# Patient Record
Sex: Female | Born: 1980 | Race: Black or African American | Hispanic: No | Marital: Married | Smoking: Former smoker
Health system: Southern US, Community
[De-identification: ages and names within clinical notes are randomized; demographics above are authoritative.]

## PROBLEM LIST (undated history)

## (undated) DIAGNOSIS — N920 Excessive and frequent menstruation with regular cycle: Secondary | ICD-10-CM

## (undated) DIAGNOSIS — M419 Scoliosis, unspecified: Secondary | ICD-10-CM

## (undated) DIAGNOSIS — E785 Hyperlipidemia, unspecified: Secondary | ICD-10-CM

## (undated) DIAGNOSIS — Z8632 Personal history of gestational diabetes: Secondary | ICD-10-CM

## (undated) DIAGNOSIS — Z973 Presence of spectacles and contact lenses: Secondary | ICD-10-CM

## (undated) DIAGNOSIS — D259 Leiomyoma of uterus, unspecified: Secondary | ICD-10-CM

## (undated) DIAGNOSIS — R7303 Prediabetes: Secondary | ICD-10-CM

## (undated) HISTORY — PX: BACK SURGERY: SHX140

## (undated) HISTORY — PX: WISDOM TOOTH EXTRACTION: SHX21

---
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
## 2001-03-28 ENCOUNTER — Emergency Department (HOSPITAL_COMMUNITY): Admission: EM | Admit: 2001-03-28 | Discharge: 2001-03-29 | Payer: Self-pay | Admitting: Emergency Medicine

## 2001-12-12 ENCOUNTER — Other Ambulatory Visit: Admission: RE | Admit: 2001-12-12 | Discharge: 2001-12-12 | Payer: Self-pay | Admitting: Obstetrics & Gynecology

## 2002-11-25 ENCOUNTER — Other Ambulatory Visit: Admission: RE | Admit: 2002-11-25 | Discharge: 2002-11-25 | Payer: Self-pay | Admitting: Obstetrics & Gynecology

## 2003-03-01 ENCOUNTER — Encounter: Admission: RE | Admit: 2003-03-01 | Discharge: 2003-03-30 | Payer: Self-pay | Admitting: Neurosurgery

## 2005-03-26 ENCOUNTER — Emergency Department (HOSPITAL_COMMUNITY): Admission: EM | Admit: 2005-03-26 | Discharge: 2005-03-27 | Payer: Self-pay | Admitting: Emergency Medicine

## 2006-01-06 ENCOUNTER — Emergency Department (HOSPITAL_COMMUNITY): Admission: EM | Admit: 2006-01-06 | Discharge: 2006-01-07 | Payer: Self-pay | Admitting: Emergency Medicine

## 2006-04-18 ENCOUNTER — Encounter (INDEPENDENT_AMBULATORY_CARE_PROVIDER_SITE_OTHER): Payer: Self-pay | Admitting: Specialist

## 2006-04-18 ENCOUNTER — Ambulatory Visit (HOSPITAL_COMMUNITY): Admission: AD | Admit: 2006-04-18 | Discharge: 2006-04-18 | Payer: Self-pay | Admitting: Obstetrics and Gynecology

## 2006-04-18 HISTORY — PX: DILATION AND EVACUATION: SHX1459

## 2006-12-24 ENCOUNTER — Encounter: Admission: RE | Admit: 2006-12-24 | Discharge: 2006-12-24 | Payer: Self-pay | Admitting: Certified Nurse Midwife

## 2007-03-04 ENCOUNTER — Inpatient Hospital Stay: Admission: AD | Admit: 2007-03-04 | Discharge: 2007-03-04 | Payer: Self-pay | Admitting: *Deleted

## 2007-03-05 ENCOUNTER — Inpatient Hospital Stay (HOSPITAL_COMMUNITY): Admission: AD | Admit: 2007-03-05 | Discharge: 2007-03-07 | Payer: Self-pay | Admitting: Obstetrics & Gynecology

## 2009-07-05 ENCOUNTER — Emergency Department (HOSPITAL_COMMUNITY): Admission: EM | Admit: 2009-07-05 | Discharge: 2009-07-05 | Payer: Self-pay | Admitting: Emergency Medicine

## 2009-08-02 ENCOUNTER — Emergency Department (HOSPITAL_COMMUNITY): Admission: EM | Admit: 2009-08-02 | Discharge: 2009-08-02 | Payer: Self-pay | Admitting: Emergency Medicine

## 2010-06-23 NOTE — Op Note (Signed)
[name], [name] NO.:  [id number]   MEDICAL RECORD NO.:  [id number]          PATIENT TYPE:  MAT   LOCATION:  [facility]                          FACILITY:  [facility]   PHYSICIAN:  Richardean Sale, M.D.   DATE OF BIRTH:  [birth year]   DATE OF PROCEDURE:  04/18/2006  DATE OF DISCHARGE:                               OPERATIVE REPORT   PREOPERATIVE DIAGNOSIS:  Abnormal uterine bleeding, presumed retained  products of conception.   POSTOPERATIVE DIAGNOSIS:  Abnormal uterine bleeding, presumed retained  products of conception.   PROCEDURE:  Suction, dilatation and curettage.   SURGEON:  Richardean Sale, MD.   ASSISTANT:  None.   ANESTHESIA:  Conscious sedation with paracervical block.   OPERATIVE FINDINGS:  An 8-week-size uterus with sound length of 8 cm,  moderate amount of old necrotic products of conception and blood clot.   SPECIMENS:  Products of conception, sent to Pathology.   ESTIMATED BLOOD LOSS:  Minimal.   COMPLICATIONS:  None.   INDICATIONS:  This is a 30 year old gravida 2, para 1-0-1-1 African  American female who was diagnosed with a missed AB in December 2007.  The patient states her bleeding ultimately stopped and she had a normal  period in February of this year.  She subsequently awoke this morning  with heavy vaginal bleeding and significant cramping, which prompted her  to report to the ER.  Ultrasound was performed, which revealed a very  thickened endometrial stripe with the tissue suspicious for retained  products of conception.  She presents now for surgical management with  D&E.  Prior to the procedure, the risks, benefits and alternatives were  discussed with the patient in detail.  We discussed the risks which  include, but are not limited to, hemorrhage requiring transfusion,  infection, injury to the uterus such as uterine perforation which would  require additional surgery to the abdomen to evaluate for any injury to  the  intra-abdominal.  We also discussed the risks of anesthesia and  other surgical-related risks.  The patient voices understanding of the  above and desires to proceed.  Informed was obtained and all questions  answered before proceeding to the OR.   PROCEDURE:  The patient was taken to the operating room, where she was  given intravenous sedation.  She was then prepped and draped in the  usual sterile fashion with Betadine and placed in the dorsal lithotomy  position.  A rubber catheter was used to drain the bladder.  Bimanual  exam was performed.  The uterus was midline, approximately 8 weeks'  size, mobile with no obvious adnexal masses.  Paracervical block was  administered using a total of 20 mL to 1% Nesacaine and a single-tooth  tenaculum was used to grasp the anterior lip of the cervix.  The uterus  then sounded to 8 cm and the cervix was very gently dilated with Hegar  dilators.  A #7 suction curette was then introduced and suction was  applied; a moderate amount of old necrotic products of conception were  removed as well as a large amount of old  blood.  After suction was  complete, this was followed by sharp curettage and there were additional  tissue fragments that were removed.  Suction was then applied one more  time to remove any loose tissue and once suction was complete, the  procedure was then terminated.  The single-tooth tenaculum was removed  and the tenaculum site was then cauterized with silver nitrate.  There  was minimal bleeding coming from the cervical os.  All instruments were  then removed from the patient's vagina and the patient was then awakened  from her anesthesia and was taken out of dorsal lithotomy position and  was taken to recovery room, awake and in stable condition and there were  no complications.  All sponge lap, needle and instrument counts were  correct x 2.      Richardean Sale, M.D.  Electronically Signed     JW/MEDQ  D:  04/18/2006  T:   04/18/2006  Job:  [id number]

## 2010-06-23 NOTE — H&P (Signed)
[name], Gross NO.:  [id number]   MEDICAL RECORD NO.:  [id number]          PATIENT TYPE:  MAT   LOCATION:  [facility]                          FACILITY:  [facility]   PHYSICIAN:  [name], M.D.   DATE OF BIRTH:  February 12, 1980   DATE OF ADMISSION:  04/18/2006  DATE OF DISCHARGE:                              HISTORY & PHYSICAL   CHIEF COMPLAINT:  Vaginal bleeding.   HISTORY OF PRESENT ILLNESS:  This is a 30 year old African-American  female, gravida 2, para 1-0-1-1, who presented today to [facility]  complaining of heavy vaginal bleeding beginning at approximately 11 a.m.  The patient also noted onset of significant cramping pain up to 9/10 in  severity.  Her last menstrual period was February 23 and was normal.  She is not currently on any type of hormonal medication for  contraception.  She denies fevers, chills or sweats.   PAST MEDICAL HISTORY:  Significant for a spontaneous AB December 2007.  The patient states she was followed with serial quantitative HCGs but  missed her last two doctor's appointments and said the quantitative HCGs  were trending down but she is uncertain if they returned all the way to  0.  She states her bleeding stopped and she had a normal cycle this past  February.   PAST MEDICAL HISTORY:  Significant for scoliosis.   SURGICAL HISTORY:  Harrington rod placement.   OBSTETRICAL HISTORY:  Spontaneous vaginal delivery x1.   GYNECOLOGIC HISTORY:  Denies abnormal Paps or sexually-transmitted  infections.   SOCIAL HISTORY:  She smokes but denies alcohol or illegal drugs.   FAMILY HISTORY:  Noncontributory.   PHYSICAL EXAMINATION:  VITAL SIGNS:  She is afebrile.  Vital signs:  Blood pressure 120/81, pulse 69, respirations 20.  GENERAL:  She is a well-developed, well-nourished African-American  female who is in no acute distress.  HEART:  Regular rate and rhythm.  LUNGS:  Clear to auscultation bilaterally.  ABDOMEN:  Soft,  nontender, nondistended, no palpable masses.  Liver and  spleen are normal.  No hernia.  EXTREMITIES:  No cyanosis, clubbing or edema.  PELVIC:  External genitalia appear normal but smeared with blood.  Speculum exam:  There is a 5 cm diameter clot present in the vault that  is removed.  Cervix appears closed.  Bimanual exam:  The uterus is  approximately 8 weeks' size, anteverted and tender to palpation.  Adnexa:  No obvious masses.  No cervical motion tenderness.   LABORATORY DATA:  Urine pregnancy test is negative.  CBC normal with  hemoglobin of 12.3.  Ultrasound was performed, which showed an  endometrial canal filled with heterogeneous material, distended to 3.4  cm.  A small anterior portion of this material shows flow, raising the  possibility of retained products of conception.  Normal-appearing  ovaries.   ASSESSMENT:  A 30 year old gravida 2, para 1-0-1-1, African-American  female with heavy vaginal bleeding and probable retained products of  conception based on ultrasound and her recent clinical history.   PLAN:  1. Recommend D&C for evacuation of any retained tissue.  Will check      serum quantitaive hCG and, if positive, will follow weekly until 0      after the procedure.  Will administer Flagyl 500 mg IV to help      protect against endometritis.  2. The risks of D&E were reviewed with the patient in detail.  We      discussed the risks, which include but are not limited to      hemorrhage requiring transfusion, infection, uterine perforation      which would require additional surgery through the abdomen and      could result in initial surgery such as hysterectomy.  We also      discussed the risks of anesthesia.  The patient voiced      understanding of all the above and desires to proceed.  Informed      consent has been obtained and all questions answered.      [name], M.D.  Electronically Signed     JW/MEDQ  D:  04/18/2006  T:  04/18/2006  Job:   [id number]

## 2010-10-26 LAB — CBC
HCT: 26.8 — ABNORMAL LOW
HCT: 35.8 — ABNORMAL LOW
HCT: 36.9
Hemoglobin: 12.3
Hemoglobin: 12.6
Hemoglobin: 9.1 — ABNORMAL LOW
MCHC: 34
MCHC: 34.2
MCHC: 34.5
MCV: 83.9
MCV: 84.6
MCV: 86.1
Platelets: 174
Platelets: 215
Platelets: 235
RBC: 3.11 — ABNORMAL LOW
RBC: 4.26
RBC: 4.36
RDW: 14.4
RDW: 14.4
RDW: 14.5
WBC: 13.2 — ABNORMAL HIGH
WBC: 5.2
WBC: 6.2

## 2010-10-26 LAB — COMPREHENSIVE METABOLIC PANEL
ALT: 10
ALT: 16
ALT: 17
AST: 24
AST: 29
Albumin: 2 — ABNORMAL LOW
Albumin: 2.6 — ABNORMAL LOW
Alkaline Phosphatase: 127 — ABNORMAL HIGH
Alkaline Phosphatase: 81
BUN: 1 — ABNORMAL LOW
BUN: 1 — ABNORMAL LOW
CO2: 22
CO2: 25
Calcium: 8 — ABNORMAL LOW
Calcium: 8.8
Chloride: 106
Chloride: 107
Creatinine, Ser: 0.46
Creatinine, Ser: 0.47
GFR calc Af Amer: >60
GFR calc Af Amer: >60
GFR calc Af Amer: >60
GFR calc non Af Amer: >60
GFR calc non Af Amer: >60
Glucose, Bld: 90
Glucose, Bld: 94
Potassium: 2.9 — ABNORMAL LOW
Potassium: 2.9 — ABNORMAL LOW
Potassium: 3.5
Sodium: 136
Sodium: 137
Sodium: 137
Total Bilirubin: 0.3
Total Bilirubin: 0.5
Total Protein: 4.6 — ABNORMAL LOW
Total Protein: 5.8 — ABNORMAL LOW
Total Protein: 6

## 2010-10-26 LAB — DIFFERENTIAL
Basophils Absolute: 0
Basophils Relative: 0
Lymphs Abs: 3.3
Monocytes Relative: 7
Neutrophils Relative %: 66

## 2010-10-26 LAB — URIC ACID
Uric Acid, Serum: 3.5
Uric Acid, Serum: 3.9
Uric Acid, Serum: 4

## 2010-10-26 LAB — RPR: RPR Ser Ql: NONREACTIVE

## 2012-01-26 ENCOUNTER — Encounter (HOSPITAL_BASED_OUTPATIENT_CLINIC_OR_DEPARTMENT_OTHER): Payer: Self-pay | Admitting: Emergency Medicine

## 2012-01-26 ENCOUNTER — Emergency Department (HOSPITAL_BASED_OUTPATIENT_CLINIC_OR_DEPARTMENT_OTHER)
Admission: EM | Admit: 2012-01-26 | Discharge: 2012-01-26 | Disposition: A | Discharge status: Home / Self Care | Payer: BC Managed Care – PPO | Attending: Emergency Medicine | Admitting: Emergency Medicine

## 2012-01-26 DIAGNOSIS — B9789 Other viral agents as the cause of diseases classified elsewhere: Secondary | ICD-10-CM

## 2012-01-26 DIAGNOSIS — R51 Headache: Secondary | ICD-10-CM | POA: Insufficient documentation

## 2012-01-26 DIAGNOSIS — M4120 Other idiopathic scoliosis, site unspecified: Secondary | ICD-10-CM | POA: Insufficient documentation

## 2012-01-26 DIAGNOSIS — F17200 Nicotine dependence, unspecified, uncomplicated: Secondary | ICD-10-CM | POA: Insufficient documentation

## 2012-01-26 DIAGNOSIS — J029 Acute pharyngitis, unspecified: Secondary | ICD-10-CM | POA: Insufficient documentation

## 2012-01-26 HISTORY — DX: Scoliosis, unspecified: M41.9

## 2012-01-26 MED ORDER — NAPROXEN 375 MG PO TABS: 375.0000 mg | ORAL_TABLET | Freq: Two times a day (BID) | ORAL | Status: DC

## 2012-01-26 MED ORDER — LIDOCAINE VISCOUS 2 % MT SOLN
20.0000 mL | Freq: Once | OROMUCOSAL | Status: AC
  Administered 2012-01-26: 15 mL via OROMUCOSAL
  Filled 2012-01-26: qty 15

## 2012-01-26 NOTE — ED Notes (Signed)
Pt c/o sore throat x 1 week

## 2012-01-26 NOTE — ED Provider Notes (Signed)
History     CSN: [id number]  Arrival date & time 01/26/12  0149   First MD Initiated Contact with Patient 01/26/12 [phone]      Chief Complaint  Patient presents with  . Sore Throat  . Headache    (Consider location/radiation/quality/duration/timing/severity/associated sxs/prior treatment) Patient is a 31 y.o. female presenting with pharyngitis and headaches. The history is provided by the patient. No language interpreter was used.  Sore Throat This is a new problem. The current episode started more than 1 week ago. The problem occurs constantly. The problem has not changed since onset.Associated symptoms include headaches. Pertinent negatives include no chest pain, no abdominal pain and no shortness of breath. Nothing aggravates the symptoms. Nothing relieves the symptoms. She has tried nothing for the symptoms. The treatment provided no relief.  Headache  This is a new problem. The current episode started more than 1 week ago. The problem occurs constantly. The problem has not changed since onset.The headache is associated with nothing. The pain is located in the frontal region. The quality of the pain is described as dull. The pain is mild. The pain does not radiate. Pertinent negatives include no anorexia, no fever, no malaise/fatigue, no chest pressure, no shortness of breath, no nausea and no vomiting. She has tried nothing for the symptoms. The treatment provided no relief.    Past Medical History  Diagnosis Date  . Scoliosis     Past Surgical History  Procedure Date  . Back surgery     No family history on file.  History  Substance Use Topics  . Smoking status: Current Every Day Smoker  . Smokeless tobacco: Not on file  . Alcohol Use: Yes    OB History    Grav Para Term Preterm Abortions TAB SAB Ect Mult Living                  Review of Systems  Constitutional: Negative for fever and malaise/fatigue.  HENT: Positive for sore throat. Negative for ear pain,  congestion, facial swelling, drooling, trouble swallowing, neck pain, neck stiffness and voice change.   Respiratory: Negative for shortness of breath.   Cardiovascular: Negative for chest pain.  Gastrointestinal: Negative for nausea, vomiting, abdominal pain and anorexia.  Neurological: Positive for headaches.  All other systems reviewed and are negative.    Allergies  Review of patient's allergies indicates no known allergies.  Home Medications  No current outpatient prescriptions on file.  BP 147/92  Pulse 72  Temp 99.3 F (37.4 C) (Oral)  Resp 18  Ht 5\' 2"  (1.575 m)  Wt 135 lb (61.236 kg)  BMI 24.69 kg/m2  SpO2 100%  Physical Exam  Constitutional: She is oriented to person, place, and time. She appears well-developed and well-nourished. No distress.  HENT:  Head: Normocephalic and atraumatic.  Right Ear: External ear normal. No mastoid tenderness. Tympanic membrane is not injected.  Left Ear: External ear normal. No mastoid tenderness. Tympanic membrane is not injected.  Mouth/Throat: Oropharynx is clear and moist. No oropharyngeal exudate.  Eyes: Conjunctivae normal and EOM are normal. Pupils are equal, round, and reactive to light.  Neck: Normal range of motion. Neck supple.  Cardiovascular: Normal rate and regular rhythm.   Pulmonary/Chest: Effort normal and breath sounds normal. No stridor. She has no wheezes.  Abdominal: Soft. Bowel sounds are normal. There is no tenderness.  Musculoskeletal: Normal range of motion.  Lymphadenopathy:    She has no cervical adenopathy.  Neurological: She is alert and  oriented to person, place, and time. No cranial nerve deficit.  Skin: Skin is warm and dry.  Psychiatric: She has a normal mood and affect.    ED Course  Procedures (including critical care time)   Labs Reviewed  RAPID STREP SCREEN   No results found.   No diagnosis found.    MDM  Pharyngitis likely viral.  No exudate no fevers.  Follow up with your  family doctor for ongoing care        Jaclin Finks K Mahamud Metts-Rasch, MD 01/26/12 [phone]

## 2012-01-26 NOTE — ED Notes (Signed)
rx x 1 given for naprosyn

## 2015-10-14 ENCOUNTER — Other Ambulatory Visit: Payer: Self-pay | Admitting: Obstetrics and Gynecology

## 2015-10-17 ENCOUNTER — Encounter (HOSPITAL_COMMUNITY): Payer: Self-pay | Admitting: *Deleted

## 2015-10-26 NOTE — Anesthesia Preprocedure Evaluation (Signed)
Anesthesia Evaluation  Patient identified by MRN, date of birth, ID band Patient awake    Reviewed: Allergy & Precautions, NPO status , Patient's Chart, lab work & pertinent test results  History of Anesthesia Complications Negative for: history of anesthetic complications  Airway Mallampati: II  TM Distance: >3 FB Neck ROM: Full    Dental no notable dental hx. (+) Dental Advisory Given   Pulmonary Current Smoker,    Pulmonary exam normal breath sounds clear to auscultation       Cardiovascular negative cardio ROS Normal cardiovascular exam Rhythm:Regular Rate:Normal     Neuro/Psych negative neurological ROS  negative psych ROS   GI/Hepatic negative GI ROS, Neg liver ROS,   Endo/Other  obesity  Renal/GU negative Renal ROS  negative genitourinary   Musculoskeletal negative musculoskeletal ROS (+)   Abdominal   Peds negative pediatric ROS (+)  Hematology negative hematology ROS (+)   Anesthesia Other Findings   Reproductive/Obstetrics negative OB ROS                             Anesthesia Physical Anesthesia Plan  ASA: II  Anesthesia Plan: General   Post-op Pain Management:    Induction: Intravenous  Airway Management Planned: LMA  Additional Equipment:   Intra-op Plan:   Post-operative Plan: Extubation in OR  Informed Consent: I have reviewed the patients History and Physical, chart, labs and discussed the procedure including the risks, benefits and alternatives for the proposed anesthesia with the patient or authorized representative who has indicated his/her understanding and acceptance.   Dental advisory given  Plan Discussed with: CRNA  Anesthesia Plan Comments:         Anesthesia Quick Evaluation

## 2015-10-26 NOTE — H&P (Signed)
[name] is an 35 y.o. female G32P2 BF presents for surgical management of 1.8 cm intracavitary fibroid noted on Sonogram done for DUB.   Pertinent Gynecological History: Menses: DUB Bleeding: dysfunctional uterine bleeding Contraception: Nexplanon DES exposure: denies Blood transfusions: none Sexually transmitted diseases: no past history Previous GYN Procedures: DNC  Last mammogram: n/Gross Date: n/Gross Last pap: normal Date: 09/28/2015 OB History: G3, P2012   Menstrual History: Menarche age: n/Gross Patient's last menstrual period was 10/09/2015 (exact date).    Past Medical History:  Diagnosis Date  . Scoliosis   . SVD (spontaneous vaginal delivery)    x 2    Past Surgical History:  Procedure Laterality Date  . BACK SURGERY     rods in lower back  . WISDOM TOOTH EXTRACTION      History reviewed. No pertinent family history.  Social History:  reports that she has been smoking Cigarettes.  She has Gross 7.50 pack-year smoking history. She has never used smokeless tobacco. She reports that she drinks alcohol. She reports that she does not use drugs.  Allergies: No Known Allergies  No prescriptions prior to admission.    Review of Systems  All other systems reviewed and are negative.   Height 5\' 2"  (1.575 m), weight 74.8 kg (165 lb), last menstrual period 10/09/2015. Physical Exam  Constitutional: She is oriented to person, place, and time. She appears well-developed and well-nourished.  HENT:  Head: Atraumatic.  Eyes: EOM are normal.  Neck: Neck supple.  Cardiovascular: Regular rhythm.   Respiratory: Breath sounds normal.  GI: Soft.  Genitourinary: Vagina normal and uterus normal.  Musculoskeletal: She exhibits no edema.  Neurological: She is alert and oriented to person, place, and time.  Skin: Skin is warm and dry.  Psychiatric: She has Gross normal mood and affect.    No results found for this or any previous visit (from the past 24 hour(s)).  No results  found.  Assessment/Plan: DUB Submucosal( intracavitary ) fibroid P) Dx hysteroscopy, D&C, hysteroscopic resection of submucosal fibroid using myosure. Procedure explained. Risk of surgery including infection, bleeding, injury to surrounding organ Structures, thermal injury, fluid overload and its mgmt, uterine perforation and its risk and mgmt.  All ? answered  [name]

## 2015-10-27 ENCOUNTER — Ambulatory Visit (HOSPITAL_COMMUNITY)
Admission: RE | Admit: 2015-10-27 | Discharge: 2015-10-27 | Disposition: A | Discharge status: Home / Self Care | Payer: 59 | Source: Ambulatory Visit | Attending: Obstetrics and Gynecology | Admitting: Obstetrics and Gynecology

## 2015-10-27 ENCOUNTER — Ambulatory Visit (HOSPITAL_COMMUNITY): Payer: 59 | Admitting: Anesthesiology

## 2015-10-27 ENCOUNTER — Encounter (HOSPITAL_COMMUNITY)
Admission: RE | Disposition: A | Discharge status: Home / Self Care | Payer: Self-pay | Source: Ambulatory Visit | Attending: Obstetrics and Gynecology

## 2015-10-27 ENCOUNTER — Encounter (HOSPITAL_COMMUNITY): Payer: Self-pay | Admitting: *Deleted

## 2015-10-27 DIAGNOSIS — F17210 Nicotine dependence, cigarettes, uncomplicated: Secondary | ICD-10-CM | POA: Insufficient documentation

## 2015-10-27 DIAGNOSIS — Z6830 Body mass index (BMI) 30.0-30.9, adult: Secondary | ICD-10-CM | POA: Diagnosis not present

## 2015-10-27 DIAGNOSIS — N938 Other specified abnormal uterine and vaginal bleeding: Secondary | ICD-10-CM | POA: Diagnosis present

## 2015-10-27 DIAGNOSIS — E861 Hypovolemia: Secondary | ICD-10-CM

## 2015-10-27 DIAGNOSIS — E669 Obesity, unspecified: Secondary | ICD-10-CM | POA: Insufficient documentation

## 2015-10-27 DIAGNOSIS — D250 Submucous leiomyoma of uterus: Secondary | ICD-10-CM

## 2015-10-27 DIAGNOSIS — M419 Scoliosis, unspecified: Secondary | ICD-10-CM | POA: Diagnosis not present

## 2015-10-27 HISTORY — PX: DILATATION & CURETTAGE/HYSTEROSCOPY WITH MYOSURE: SHX6511

## 2015-10-27 LAB — BASIC METABOLIC PANEL
ANION GAP: 4 — AB (ref 5–15)
BUN: 11 mg/dL (ref 6–20)
CALCIUM: 8.1 mg/dL — AB (ref 8.9–10.3)
CO2: 25 mmol/L (ref 22–32)
CREATININE: 0.67 mg/dL (ref 0.44–1.00)
Chloride: 109 mmol/L (ref 101–111)
GFR calc Af Amer: >60 mL/min (ref >60)
GFR calc non Af Amer: >60 mL/min (ref >60)
GLUCOSE: 102 mg/dL — AB (ref 65–99)
Potassium: 4.3 mmol/L (ref 3.5–5.1)
Sodium: 138 mmol/L (ref 135–145)

## 2015-10-27 LAB — CBC
HCT: 36.8 % (ref 36.0–46.0)
Hemoglobin: 12.5 g/dL (ref 12.0–15.0)
MCH: 27.8 pg (ref 26.0–34.0)
MCHC: 34 g/dL (ref 30.0–36.0)
MCV: 82 fL (ref 78.0–100.0)
PLATELETS: 276 10*3/uL (ref 150–400)
RBC: 4.49 MIL/uL (ref 3.87–5.11)
RDW: 13.2 % (ref 11.5–15.5)
WBC: 7.4 10*3/uL (ref 4.0–10.5)

## 2015-10-27 LAB — PREGNANCY, URINE: PREG TEST UR: NEGATIVE

## 2015-10-27 SURGERY — DILATATION & CURETTAGE/HYSTEROSCOPY WITH MYOSURE
Anesthesia: General

## 2015-10-27 MED ORDER — OXYCODONE HCL 5 MG PO TABS
ORAL_TABLET | ORAL | Status: AC
  Filled 2015-10-27: qty 1

## 2015-10-27 MED ORDER — FENTANYL CITRATE (PF) 250 MCG/5ML IJ SOLN
INTRAMUSCULAR | Status: DC | PRN
  Administered 2015-10-27 (×4): 50 ug via INTRAVENOUS

## 2015-10-27 MED ORDER — SCOPOLAMINE 1 MG/3DAYS TD PT72
MEDICATED_PATCH | TRANSDERMAL | Status: AC
  Filled 2015-10-27: qty 1

## 2015-10-27 MED ORDER — KETOROLAC TROMETHAMINE 30 MG/ML IJ SOLN
INTRAMUSCULAR | Status: DC | PRN
  Administered 2015-10-27: 30 mg via INTRAMUSCULAR
  Administered 2015-10-27: 30 mg via INTRAVENOUS

## 2015-10-27 MED ORDER — DEXAMETHASONE SODIUM PHOSPHATE 10 MG/ML IJ SOLN
INTRAMUSCULAR | Status: AC
  Filled 2015-10-27: qty 1

## 2015-10-27 MED ORDER — GLYCOPYRROLATE 0.2 MG/ML IJ SOLN
INTRAMUSCULAR | Status: DC | PRN
  Administered 2015-10-27: .05 mg via INTRAVENOUS

## 2015-10-27 MED ORDER — FENTANYL CITRATE (PF) 100 MCG/2ML IJ SOLN
INTRAMUSCULAR | Status: AC
  Filled 2015-10-27: qty 4

## 2015-10-27 MED ORDER — SCOPOLAMINE 1 MG/3DAYS TD PT72
1.0000 | MEDICATED_PATCH | Freq: Once | TRANSDERMAL | Status: DC
  Administered 2015-10-27: 1.5 mg via TRANSDERMAL

## 2015-10-27 MED ORDER — LIDOCAINE HCL (CARDIAC) 20 MG/ML IV SOLN
INTRAVENOUS | Status: DC | PRN
  Administered 2015-10-27: 100 mg via INTRAVENOUS

## 2015-10-27 MED ORDER — ONDANSETRON HCL 4 MG/2ML IJ SOLN: 4.0000 mg | Freq: Once | INTRAMUSCULAR | Status: DC | PRN

## 2015-10-27 MED ORDER — LIDOCAINE HCL (CARDIAC) 20 MG/ML IV SOLN
INTRAVENOUS | Status: AC
  Filled 2015-10-27: qty 5

## 2015-10-27 MED ORDER — FENTANYL CITRATE (PF) 100 MCG/2ML IJ SOLN: 25.0000 ug | INTRAMUSCULAR | Status: DC | PRN

## 2015-10-27 MED ORDER — OXYCODONE HCL 5 MG/5ML PO SOLN: 5.0000 mg | Freq: Once | ORAL | Status: AC | PRN

## 2015-10-27 MED ORDER — IBUPROFEN 800 MG PO TABS: 800.0000 mg | ORAL_TABLET | Freq: Three times a day (TID) | ORAL | 0 refills | Status: DC | PRN

## 2015-10-27 MED ORDER — DEXAMETHASONE SODIUM PHOSPHATE 4 MG/ML IJ SOLN
INTRAMUSCULAR | Status: DC | PRN
  Administered 2015-10-27: 4 mg via INTRAVENOUS

## 2015-10-27 MED ORDER — PROPOFOL 10 MG/ML IV BOLUS
INTRAVENOUS | Status: DC | PRN
  Administered 2015-10-27 (×2): 30 mg via INTRAVENOUS
  Administered 2015-10-27: 40 mg via INTRAVENOUS
  Administered 2015-10-27: 200 mg via INTRAVENOUS

## 2015-10-27 MED ORDER — MIDAZOLAM HCL 2 MG/2ML IJ SOLN
INTRAMUSCULAR | Status: DC | PRN
  Administered 2015-10-27: 2 mg via INTRAVENOUS

## 2015-10-27 MED ORDER — MIDAZOLAM HCL 2 MG/2ML IJ SOLN
INTRAMUSCULAR | Status: AC
  Filled 2015-10-27: qty 2

## 2015-10-27 MED ORDER — PROPOFOL 10 MG/ML IV BOLUS
INTRAVENOUS | Status: AC
  Filled 2015-10-27: qty 20

## 2015-10-27 MED ORDER — ONDANSETRON HCL 4 MG/2ML IJ SOLN
INTRAMUSCULAR | Status: AC
  Filled 2015-10-27: qty 2

## 2015-10-27 MED ORDER — ONDANSETRON HCL 4 MG/2ML IJ SOLN
INTRAMUSCULAR | Status: DC | PRN
  Administered 2015-10-27: 4 mg via INTRAVENOUS

## 2015-10-27 MED ORDER — SODIUM CHLORIDE 0.9 % IR SOLN
Status: DC | PRN
  Administered 2015-10-27: 3000 mL

## 2015-10-27 MED ORDER — LIDOCAINE HCL 2 % EX GEL
CUTANEOUS | Status: AC
  Filled 2015-10-27: qty 5

## 2015-10-27 MED ORDER — OXYCODONE HCL 5 MG PO TABS
5.0000 mg | ORAL_TABLET | Freq: Once | ORAL | Status: AC | PRN
  Administered 2015-10-27: 5 mg via ORAL

## 2015-10-27 MED ORDER — LACTATED RINGERS IV SOLN
INTRAVENOUS | Status: DC
Start: 2015-10-27 — End: 2015-10-27
  Administered 2015-10-27: 07:00:00 via INTRAVENOUS

## 2015-10-27 SURGICAL SUPPLY — 22 items
CANISTER SUCT 3000ML (MISCELLANEOUS) ×2 IMPLANT
CATH ROBINSON RED A/P 16FR (CATHETERS) ×2 IMPLANT
CLOTH BEACON ORANGE TIMEOUT ST (SAFETY) ×2 IMPLANT
CONTAINER PREFILL 10% NBF 60ML (FORM) ×4 IMPLANT
DEVICE MYOSURE LITE (MISCELLANEOUS) IMPLANT
DEVICE MYOSURE REACH (MISCELLANEOUS) IMPLANT
ELECT REM PT RETURN 9FT ADLT (ELECTROSURGICAL) ×2
ELECTRODE REM PT RTRN 9FT ADLT (ELECTROSURGICAL) ×1 IMPLANT
FILTER ARTHROSCOPY CONVERTOR (FILTER) ×2 IMPLANT
GLOVE BIOGEL PI IND STRL 7.0 (GLOVE) ×2 IMPLANT
GLOVE BIOGEL PI INDICATOR 7.0 (GLOVE) ×2
GLOVE ECLIPSE 6.5 STRL STRAW (GLOVE) ×2 IMPLANT
GOWN STRL REUS W/TWL LRG LVL3 (GOWN DISPOSABLE) ×4 IMPLANT
MYOSURE XL FIBROID REM (MISCELLANEOUS) ×2
PACK VAGINAL MINOR WOMEN LF (CUSTOM PROCEDURE TRAY) ×2 IMPLANT
PAD OB MATERNITY 4.3X12.25 (PERSONAL CARE ITEMS) ×2 IMPLANT
SEAL ROD LENS SCOPE MYOSURE (ABLATOR) ×2 IMPLANT
SYSTEM TISS REMOVAL MYSR XL RM (MISCELLANEOUS) IMPLANT
TOWEL OR 17X24 6PK STRL BLUE (TOWEL DISPOSABLE) ×4 IMPLANT
TUBING AQUILEX INFLOW (TUBING) ×2 IMPLANT
TUBING AQUILEX OUTFLOW (TUBING) ×2 IMPLANT
WATER STERILE IRR 1000ML POUR (IV SOLUTION) ×2 IMPLANT

## 2015-10-27 NOTE — Brief Op Note (Signed)
10/27/2015  8:46 AM  PATIENT:  [name]  35 y.o. female  PRE-OPERATIVE DIAGNOSIS:  Submucosal Fibroid, Dysfunctional Uterine Bleeding  POST-OPERATIVE DIAGNOSIS:  SUBMUCOSAL FIBROID DYSFUNCTIONAL UTERINE BLEEDING  PROCEDURE:  Diagnostic hysteroscopy, Dilation and curettage, hysteroscopic resection of SM fibroid  SURGEON:  Surgeon(s) and Role:    * Servando Salina, MD - Primary  PHYSICIAN ASSISTANT:   ASSISTANTS: none   ANESTHESIA:   general Findings: large anterior intracavitary/SM fibroid. Tubal ostia( L>r) seen after resection of fibroid, nl endocervical canal EBL:  Total I/O In: 1300 [I.V.:1300] Out: 80 [Urine:60; Blood:20]  BLOOD ADMINISTERED:none  DRAINS: none   LOCAL MEDICATIONS USED:  NONE  SPECIMEN:  Source of Specimen:  EMC with fibroid resection  DISPOSITION OF SPECIMEN:  PATHOLOGY  COUNTS:  YES  TOURNIQUET:  * No tourniquets in log *  DICTATION: .Other Dictation: Dictation Number [id number]  PLAN OF CARE: Discharge to home after PACU  PATIENT DISPOSITION:  PACU - hemodynamically stable.   Delay start of Pharmacological VTE agent (>24hrs) due to surgical blood loss or risk of bleeding: no

## 2015-10-27 NOTE — Discharge Instructions (Signed)
DISCHARGE INSTRUCTIONS: HYSTEROSCOPY / ENDOMETRIAL ABLATION The following instructions have been prepared to help you care for yourself upon your return home.  May Remove Scop patch on or before 10/30/2015  May take Ibuprofen after 2:00 PM as needed for cramps  Personal hygiene:  Use sanitary pads for vaginal drainage, not tampons.  Shower the day after your procedure.  NO tub baths, pools or Jacuzzis for 2-3 weeks.  Wipe front to back after using the bathroom.  Activity and limitations:  Do NOT drive or operate any equipment for 24 hours. The effects of anesthesia are still present and drowsiness may result.  Do NOT rest in bed all day.  Walking is encouraged.  Walk up and down stairs slowly.  You may resume your normal activity in one to two days or as indicated by your physician.  Sexual activity: NO intercourse for at least 2 weeks after the procedure, or as indicated by your Doctor.  Diet: Eat a light meal as desired this evening. You may resume your usual diet tomorrow.  Return to Work: You may resume your work activities in one to two days or as indicated by Marine scientist.  What to expect after your surgery: Expect to have vaginal bleeding/discharge for 2-3 days and spotting for up to 10 days. It is not unusual to have soreness for up to 1-2 weeks. You may have a slight burning sensation when you urinate for the first day. Mild cramps may continue for a couple of days. You may have a regular period in 2-6 weeks.  Call your doctor for any of the following:  Excessive vaginal bleeding or clotting, saturating and changing one pad every hour.  Inability to urinate 6 hours after discharge from hospital.  Pain not relieved by pain medication.  Fever of 100.4 F or greater.  Unusual vaginal discharge or odor.

## 2015-10-27 NOTE — Anesthesia Postprocedure Evaluation (Signed)
Anesthesia Post Note  Patient: [name]  Procedure(s) Performed: Procedure(s) (LRB): DILATATION & CURETTAGE/HYSTEROSCOPY WITH MYOSURE WITH RESECTION OF FIBROID (N/A)  Patient location during evaluation: PACU Anesthesia Type: General Level of consciousness: awake and alert Pain management: pain level controlled Vital Signs Assessment: post-procedure vital signs reviewed and stable Respiratory status: spontaneous breathing, nonlabored ventilation, respiratory function stable and patient connected to nasal cannula oxygen Cardiovascular status: blood pressure returned to baseline and stable Postop Assessment: no signs of nausea or vomiting Anesthetic complications: no Comments: Patient reports feeling bloated. Dr. Garwin Brothers aware and saw patient in PACU. Reports that this is normal for this procedure and reassured the patient.      Last Vitals:  Vitals:   10/27/15 0915 10/27/15 1000  BP: (!) 131/96   Pulse: 60   Resp: 20   Temp:  36.6 C    Last Pain:  Vitals:   10/27/15 0915  TempSrc:   PainSc: 0-No pain   Pain Goal: Patients Stated Pain Goal: 3 (10/27/15 0915)               Loreal Schuessler, Earney Navy

## 2015-10-27 NOTE — OR Nursing (Signed)
Physician notified at  [place] deficit at 531mls.  Physician continued procedure.  Physician notified at 0750 deficit at 1492mls.  Equipment tubing changed and physican continued with procedure.  Physican notified at 0758 deficit at 2197mls.  Physician continued with procedure.  Final deficit of 2723mls.

## 2015-10-27 NOTE — Anesthesia Procedure Notes (Signed)
Procedure Name: LMA Insertion Date/Time: 10/27/2015 7:27 AM Performed by: Flossie Dibble Pre-anesthesia Checklist: Patient identified, Timeout performed, Emergency Drugs available, Suction available and Patient being monitored Patient Re-evaluated:Patient Re-evaluated prior to inductionOxygen Delivery Method: Circle system utilized Preoxygenation: Pre-oxygenation with 100% oxygen Intubation Type: IV induction Ventilation: Oral airway inserted - appropriate to patient size and Mask ventilation without difficulty LMA: LMA inserted LMA Size: 4.0 Number of attempts: 1 Placement Confirmation: positive ETCO2 and breath sounds checked- equal and bilateral Tube secured with: Tape Dental Injury: Teeth and Oropharynx as per pre-operative assessment  Difficulty Due To: Difficult Airway- due to limited oral opening and Difficult Airway- due to dentition

## 2015-10-27 NOTE — Transfer of Care (Signed)
Immediate Anesthesia Transfer of Care Note  Patient: [name]  Procedure(s) Performed: Procedure(s) with comments: DILATATION & CURETTAGE/HYSTEROSCOPY WITH MYOSURE WITH RESECTION OF FIBROID (N/A) - Requests 1 hr.  Patient Location: PACU  Anesthesia Type:General  Level of Consciousness: awake, alert  and oriented  Airway & Oxygen Therapy: Patient Spontanous Breathing and Patient connected to nasal cannula oxygen  Post-op Assessment: Report given to RN and Post -op Vital signs reviewed and stable  Post vital signs: Reviewed and stable  Last Vitals:  Vitals:   10/27/15 0611  BP: (!) 142/101  Pulse: 75  Resp: 18  Temp: 36.8 C    Last Pain:  Vitals:   10/27/15 0611  TempSrc: Oral      Patients Stated Pain Goal: 3 [id number] [phone]  Complications: No apparent anesthesia complications

## 2015-10-28 NOTE — Addendum Note (Signed)
Addendum  created 10/28/15 1456 by Flossie Dibble, CRNA   Anesthesia Intra Meds edited, Charge Capture section accepted

## 2015-10-28 NOTE — Op Note (Signed)
NAME:  [name], [name]               ACCOUNT NO.:  [id number]  MEDICAL RECORD NO.:  [id number]  LOCATION:  [facility]                          FACILITY:  [facility]  PHYSICIAN:  Servando Salina, M.D.DATE OF BIRTH:  1980/12/25  DATE OF PROCEDURE: DATE OF DISCHARGE:  10/27/2015                              OPERATIVE REPORT   PREOPERATIVE DIAGNOSIS:  Dysfunctional uterine bleeding.  Submucosal fibroid.  PROCEDURE:  Diagnostic hysteroscopy, hysteroscopic resection of submucosal fibroid, dilation and curettage.  POSTOPERATIVE DIAGNOSIS:  Dysfunctional uterine bleeding.  Submucosal fibroid.  ANESTHESIA:  General.  ASSISTANT:  None.  DESCRIPTION OF PROCEDURE:  Under adequate general anesthesia, the patient was placed in dorsal lithotomy position.  She was sterilely prepped and draped in usual fashion.  Bladder catheterized for moderate amount of urine.  Examination under anesthesia revealed anteverted uterus.  No adnexal masses could be appreciated.  Bivalve speculum was placed in the vagina.  Single-tooth tenaculum was placed on anterior lip of the cervix.  The cervix was then serially dilated up to #[facility] dilator.  The MyoSure hysteroscope was introduced into the uterine cavity.  On inspection, there was a large anterior intracavitary submucosal fibroid that was noted.  The tubal ostia could not be seen. The MyoSure resectoscope XL was subsequently inserted.  Resection was started on the right. At that point, it was noted that the fluid deficit was increasing even prior to the resection the deficit of over 2000 was initially noted despite the fact of only 900 mL of saline had been used in the bag.  At that point, the procedure was stopped.  The tubings for the insufflation was replaced after the canister had been initially checked first.  After that was done, the cavity was re insufflated and the resection was started, 3/4 of the way into the resection was evidence of increased fluid  deficit despite not much been seen in the bag.  By the time the resection was done with increased deficit, the procedure at that point was terminated.  The resectoscope was removed. The cavity was curetted.  The floor was inspected as was the blanket that the patient is on.  Calculation ultimately had a fluid deficit of 2750.  The patient's abdomen appeared to be distended; however, the patient had no evidence of any anesthetic effect or difficulty with her ventilation.  All instruments were subsequently removed.  SPECIMEN:  Endometrial curetting with fibroid resection sent to Pathology.  ESTIMATED BLOOD LOSS:  Less than 20 mL.  COMPLICATION:  None.  The patient tolerated the procedure well was transferred to recovery in stable condition.  Basic metabolic profile was obtained, which subsequently was normal.     Servando Salina, M.D.     [place]/MEDQ  D:  10/27/2015  T:  10/28/2015  Job:  [id number]

## 2015-10-31 ENCOUNTER — Encounter (HOSPITAL_COMMUNITY): Payer: Self-pay | Admitting: Obstetrics and Gynecology

## 2017-04-25 ENCOUNTER — Ambulatory Visit
Admission: RE | Admit: 2017-04-25 | Discharge: 2017-04-25 | Disposition: A | Discharge status: Home / Self Care | Payer: 59 | Source: Ambulatory Visit | Attending: Obstetrics and Gynecology | Admitting: Obstetrics and Gynecology

## 2017-04-25 ENCOUNTER — Other Ambulatory Visit: Payer: Self-pay | Admitting: Obstetrics and Gynecology

## 2017-04-25 DIAGNOSIS — Z3046 Encounter for surveillance of implantable subdermal contraceptive: Secondary | ICD-10-CM

## 2017-05-22 ENCOUNTER — Other Ambulatory Visit (HOSPITAL_COMMUNITY): Payer: Self-pay | Admitting: Obstetrics and Gynecology

## 2017-05-22 DIAGNOSIS — Z975 Presence of (intrauterine) contraceptive device: Secondary | ICD-10-CM

## 2017-05-24 ENCOUNTER — Ambulatory Visit (HOSPITAL_COMMUNITY): Payer: 59

## 2017-05-29 ENCOUNTER — Ambulatory Visit (HOSPITAL_COMMUNITY)
Admission: RE | Admit: 2017-05-29 | Discharge: 2017-05-29 | Disposition: A | Discharge status: Home / Self Care | Payer: 59 | Source: Ambulatory Visit | Attending: Obstetrics and Gynecology | Admitting: Obstetrics and Gynecology

## 2017-05-29 DIAGNOSIS — Z975 Presence of (intrauterine) contraceptive device: Secondary | ICD-10-CM | POA: Diagnosis not present

## 2019-02-05 ENCOUNTER — Ambulatory Visit: Payer: 59 | Attending: Internal Medicine

## 2019-02-05 DIAGNOSIS — Z20822 Contact with and (suspected) exposure to covid-19: Secondary | ICD-10-CM

## 2019-02-06 LAB — NOVEL CORONAVIRUS, NAA: SARS-CoV-2, NAA: NOT DETECTED

## 2019-04-23 ENCOUNTER — Ambulatory Visit: Payer: 59 | Attending: Internal Medicine

## 2019-04-23 DIAGNOSIS — Z23 Encounter for immunization: Secondary | ICD-10-CM

## 2019-04-23 NOTE — Progress Notes (Signed)
   Covid-19 Vaccination Clinic  Name:  [name]    MRN: [id number] DOB: [birth year]  04/23/2019  [name] was observed post Covid-19 immunization for 15 minutes without incident. She was provided with Vaccine Information Sheet and instruction to access the V-Safe system.   [name] was instructed to call 911 with any severe reactions post vaccine: Marland Kitchen Difficulty breathing  . Swelling of face and throat  . A fast heartbeat  . A bad rash all over body  . Dizziness and weakness   Immunizations Administered    Name Date Dose VIS Date Route   Pfizer COVID-19 Vaccine 04/23/2019  4:35 PM 0.3 mL 01/16/2019 Intramuscular   Manufacturer: [place]   Lot: [id number]   [place]: [id number]

## 2019-05-19 ENCOUNTER — Ambulatory Visit: Payer: 59 | Attending: Internal Medicine

## 2019-05-19 DIAGNOSIS — Z23 Encounter for immunization: Secondary | ICD-10-CM

## 2019-05-19 NOTE — Progress Notes (Signed)
   Covid-19 Vaccination Clinic  Name:  Eloda Cedotal Malenfant    MRN: [id number] DOB: [birth year]  05/19/2019  Ms. Kauth was observed post Covid-19 immunization for 15 minutes without incident. She was provided with Vaccine Information Sheet and instruction to access the V-Safe system.   Ms. Pagnotta was instructed to call 911 with any severe reactions post vaccine: Marland Kitchen Difficulty breathing  . Swelling of face and throat  . A fast heartbeat  . A bad rash all over body  . Dizziness and weakness   Immunizations Administered    Name Date Dose VIS Date Route   Pfizer COVID-19 Vaccine 05/19/2019  4:25 PM 0.3 mL 01/16/2019 Intramuscular   Manufacturer: [place]   Lot: [id number]   [place]: [id number]

## 2020-03-02 ENCOUNTER — Ambulatory Visit (INDEPENDENT_AMBULATORY_CARE_PROVIDER_SITE_OTHER): Payer: 59 | Admitting: Cardiovascular Disease

## 2020-03-02 ENCOUNTER — Encounter: Payer: Self-pay | Admitting: Cardiovascular Disease

## 2020-03-02 ENCOUNTER — Other Ambulatory Visit: Payer: Self-pay

## 2020-03-02 VITALS — BP 140/108 | HR 94 | Ht 62.0 in | Wt 164.0 lb

## 2020-03-02 DIAGNOSIS — E669 Obesity, unspecified: Secondary | ICD-10-CM | POA: Diagnosis not present

## 2020-03-02 DIAGNOSIS — I10 Essential (primary) hypertension: Secondary | ICD-10-CM

## 2020-03-02 NOTE — Patient Instructions (Signed)
Medication Instructions:  NONE    Labwork: BMET/TSH/RENIN/ALDSTERONE TODAY    Testing/Procedures: Your physician has requested that you have a renal artery duplex. During this test, an ultrasound is used to evaluate blood flow to the kidneys. Allow one hour for this exam. Do not eat after midnight the day before and avoid carbonated beverages. Take your medications as you usually do.   Follow-Up: 06/01/2020 AT 3:30 PM WITH DR [facility]    Special Instructions:   TRY TO EXERCISE 150 MINUTES EACH WEEK  LIMIT YOUR SALT TO 1,500 MG DAILY   DASH Eating Plan DASH stands for "Dietary Approaches to Stop Hypertension." The DASH eating plan is a healthy eating plan that has been shown to reduce high blood pressure (hypertension). It may also reduce your risk for type 2 diabetes, heart disease, and stroke. The DASH eating plan may also help with weight loss. What are tips for following this plan?  General guidelines  Avoid eating more than 2,300 mg (milligrams) of salt (sodium) a day. If you have hypertension, you may need to reduce your sodium intake to 1,500 mg a day.  Limit alcohol intake to no more than 1 drink a day for nonpregnant women and 2 drinks a day for men. One drink equals 12 oz of beer, 5 oz of wine, or 1 oz of hard liquor.  Work with your health care provider to maintain a healthy body weight or to lose weight. Ask what an ideal weight is for you.  Get at least 30 minutes of exercise that causes your heart to beat faster (aerobic exercise) most days of the week. Activities may include walking, swimming, or biking.  Work with your health care provider or diet and nutrition specialist (dietitian) to adjust your eating plan to your individual calorie needs. Reading food labels   Check food labels for the amount of sodium per serving. Choose foods with less than 5 percent of the Daily Value of sodium. Generally, foods with less than 300 mg of sodium per serving fit into this  eating plan.  To find whole grains, look for the word "whole" as the first word in the ingredient list. Shopping  Buy products labeled as "low-sodium" or "no salt added."  Buy fresh foods. Avoid canned foods and premade or frozen meals. Cooking  Avoid adding salt when cooking. Use salt-free seasonings or herbs instead of table salt or sea salt. Check with your health care provider or pharmacist before using salt substitutes.  Do not fry foods. Cook foods using healthy methods such as baking, boiling, grilling, and broiling instead.  Cook with heart-healthy oils, such as olive, canola, soybean, or sunflower oil. Meal planning  Eat a balanced diet that includes: ? 5 or more servings of fruits and vegetables each day. At each meal, try to fill half of your plate with fruits and vegetables. ? Up to 6-8 servings of whole grains each day. ? Less than 6 oz of lean meat, poultry, or fish each day. A 3-oz serving of meat is about the same size as a deck of cards. One egg equals 1 oz. ? 2 servings of low-fat dairy each day. ? A serving of nuts, seeds, or beans 5 times each week. ? Heart-healthy fats. Healthy fats called Omega-3 fatty acids are found in foods such as flaxseeds and coldwater fish, like sardines, salmon, and mackerel.  Limit how much you eat of the following: ? Canned or prepackaged foods. ? Food that is high in trans fat, such  as fried foods. ? Food that is high in saturated fat, such as fatty meat. ? Sweets, desserts, sugary drinks, and other foods with added sugar. ? Full-fat dairy products.  Do not salt foods before eating.  Try to eat at least 2 vegetarian meals each week.  Eat more home-cooked food and less restaurant, buffet, and fast food.  When eating at a restaurant, ask that your food be prepared with less salt or no salt, if possible. What foods are recommended? The items listed may not be a complete list. Talk with your dietitian about what dietary choices are  best for you. Grains Whole-grain or whole-wheat bread. Whole-grain or whole-wheat pasta. Brown rice. [place]. Bulgur. Whole-grain and low-sodium cereals. Pita bread. Low-fat, low-sodium crackers. Whole-wheat flour tortillas. Vegetables Fresh or frozen vegetables (raw, steamed, roasted, or grilled). Low-sodium or reduced-sodium tomato and vegetable juice. Low-sodium or reduced-sodium tomato sauce and tomato paste. Low-sodium or reduced-sodium canned vegetables. Fruits All fresh, dried, or frozen fruit. Canned fruit in natural juice (without added sugar). Meat and other protein foods Skinless chicken or [place]. Ground chicken or [place]. Pork with fat trimmed off. Fish and seafood. Egg whites. Dried beans, peas, or lentils. Unsalted nuts, nut butters, and seeds. Unsalted canned beans. Lean cuts of beef with fat trimmed off. Low-sodium, lean deli meat. Dairy Low-fat (1%) or fat-free (skim) milk. Fat-free, low-fat, or reduced-fat cheeses. Nonfat, low-sodium ricotta or cottage cheese. Low-fat or nonfat yogurt. Low-fat, low-sodium cheese. Fats and oils Soft margarine without trans fats. Vegetable oil. Low-fat, reduced-fat, or light mayonnaise and salad dressings (reduced-sodium). Canola, safflower, olive, soybean, and sunflower oils. Avocado. Seasoning and other foods Herbs. Spices. Seasoning mixes without salt. Unsalted popcorn and pretzels. Fat-free sweets. What foods are not recommended? The items listed may not be a complete list. Talk with your dietitian about what dietary choices are best for you. Grains Baked goods made with fat, such as croissants, muffins, or some breads. Dry pasta or rice meal packs. Vegetables Creamed or fried vegetables. Vegetables in a cheese sauce. Regular canned vegetables (not low-sodium or reduced-sodium). Regular canned tomato sauce and paste (not low-sodium or reduced-sodium). Regular tomato and vegetable juice (not low-sodium or reduced-sodium). Angie Fava.  Olives. Fruits Canned fruit in a light or heavy syrup. Fried fruit. Fruit in cream or butter sauce. Meat and other protein foods Fatty cuts of meat. Ribs. Fried meat. Berniece Salines. Sausage. Bologna and other processed lunch meats. Salami. Fatback. Hotdogs. Bratwurst. Salted nuts and seeds. Canned beans with added salt. Canned or smoked fish. Whole eggs or egg yolks. Chicken or [place] with skin. Dairy Whole or 2% milk, cream, and half-and-half. Whole or full-fat cream cheese. Whole-fat or sweetened yogurt. Full-fat cheese. Nondairy creamers. Whipped toppings. Processed cheese and cheese spreads. Fats and oils Butter. Stick margarine. Lard. Shortening. Ghee. Bacon fat. Tropical oils, such as coconut, palm kernel, or palm oil. Seasoning and other foods Salted popcorn and pretzels. Onion salt, garlic salt, seasoned salt, table salt, and sea salt. Worcestershire sauce. Tartar sauce. Barbecue sauce. Teriyaki sauce. Soy sauce, including reduced-sodium. Steak sauce. Canned and packaged gravies. Fish sauce. Oyster sauce. Cocktail sauce. Horseradish that you find on the shelf. Ketchup. Mustard. Meat flavorings and tenderizers. Bouillon cubes. Hot sauce and Tabasco sauce. Premade or packaged marinades. Premade or packaged taco seasonings. Relishes. Regular salad dressings. Where to find more information:  National Heart, Lung, and [place]: https://wilson-eaton.com/  American Heart Association: www.heart.org Summary  The DASH eating plan is a healthy eating plan that has been shown  to reduce high blood pressure (hypertension). It may also reduce your risk for type 2 diabetes, heart disease, and stroke.  With the DASH eating plan, you should limit salt (sodium) intake to 2,300 mg a day. If you have hypertension, you may need to reduce your sodium intake to 1,500 mg a day.  When on the DASH eating plan, aim to eat more fresh fruits and vegetables, whole grains, lean proteins, low-fat dairy, and heart-healthy  fats.  Work with your health care provider or diet and nutrition specialist (dietitian) to adjust your eating plan to your individual calorie needs. This information is not intended to replace advice given to you by your health care provider. Make sure you discuss any questions you have with your health care provider. Document Released: 01/11/2011 Document Revised: 01/04/2017 Document Reviewed: 01/16/2016 Elsevier Patient Education  2020 [employer].

## 2020-03-02 NOTE — Progress Notes (Signed)
Hypertension Clinic Initial Assessment:    Date:  03/03/2020   ID:  [name], DOB [birth year], MRN [id number]  PCP:  Patient, No Pcp Per  Cardiologist:  No primary care provider on file.  Nephrologist:  Referring MD: Evelyn Salina, MD   CC: Hypertension  History of Present Illness:    [name] is a 40 y.o. female with a hx of hypertension here to establish care in the hypertension clinic.  She saw Dr. Garwin Gross 01/2020 and her BP was 151/101.  Since then her blood pressure has been in the 120s 130s over 80s to 100s.  She has a history of 3 pregnancies.  She had gestational diabetes with 1 but no blood pressure issues.  Last week she had shingles.  Prior to that she was going to the gym 2 or 3 days/week.  She exercises for about 45 minutes doing the elliptical and strength training.  She does about 20 minutes of cardio each time.  She feels good with exercise and has no chest pain or shortness of breath she mostly cooks at home and has a little salt to her foods.  She denies any lower extremity edema, orthopnea, or PND.  She drinks 2 to 3 cups of coffee daily and has some soda and tea.  She has alcohol socially she does snore but has no apnea.  She feels well-rested in the morning  Previous antihypertensives: None    Past Medical History:  Diagnosis Date  . Essential hypertension 03/03/2020  . Obesity (BMI 30.0-34.9) 03/03/2020  . Scoliosis   . SVD (spontaneous vaginal delivery)    x 2    Past Surgical History:  Procedure Laterality Date  . BACK SURGERY     rods in lower back  . DILATATION & CURETTAGE/HYSTEROSCOPY WITH MYOSURE N/A 10/27/2015   Procedure: DILATATION & CURETTAGE/HYSTEROSCOPY WITH MYOSURE WITH RESECTION OF FIBROID;  Surgeon: Evelyn Salina, MD;  Location: [facility];  Service: Gynecology;  Laterality: N/A;  Requests 1 hr.  . WISDOM TOOTH EXTRACTION      Current Medications: Current Meds  Medication Sig  . acetaminophen (TYLENOL) 500 MG tablet Take  500 mg by mouth every 6 (six) hours as needed.     Allergies:   Patient has no known allergies.   Social History   Socioeconomic History  . Marital status: Single    Spouse name: Not on file  . Number of children: Not on file  . Years of education: Not on file  . Highest education level: Not on file  Occupational History  . Not on file  Tobacco Use  . Smoking status: Current Every Day Smoker    Packs/day: 0.50    Years: 15.00    Pack years: 7.50    Types: Cigarettes  . Smokeless tobacco: Never Used  Substance and Sexual Activity  . Alcohol use: Yes    Comment: occasional wine 2/month  . Drug use: No  . Sexual activity: Yes    Birth control/protection: Implant    Comment: implanon  Other Topics Concern  . Not on file  Social History Narrative  . Not on file   Social Determinants of Health   Financial Resource Strain: Not on file  Food Insecurity: Not on file  Transportation Needs: Not on file  Physical Activity: Not on file  Stress: Not on file  Social Connections: Not on file     Family History: The patient's family history includes Diabetes in her maternal grandmother; Heart failure (age  of onset: 53) in her father; Hypertension in her mother.  ROS:   Please see the history of present illness.    All other systems reviewed and are negative.  EKGs/Labs/Other Studies Reviewed:    EKG:  EKG is ordered today.  The ekg ordered today demonstrates sinus rhythm.  Rate 94 bpm.  LVH with secondary examination abnormalities.  Recent Labs: 03/02/2020: BUN 7; Creatinine, Ser 0.66; Potassium 4.2; Sodium 144; TSH 0.654   Recent Lipid Panel No results found for: CHOL, TRIG, HDL, CHOLHDL, VLDL, LDLCALC, LDLDIRECT  Physical Exam:    VS:  BP (!) 140/108 (BP Location: Left Arm)   Pulse 94   Ht 5\' 2"  (1.575 m)   Wt 164 lb (74.4 kg)   BMI 30.00 kg/m  , BMI Body mass index is 30 kg/m. GENERAL:  Well appearing HEENT: Pupils equal round and reactive, fundi not  visualized, oral mucosa unremarkable NECK:  No jugular venous distention, waveform within normal limits, carotid upstroke brisk and symmetric, no bruits LUNGS:  Clear to auscultation bilaterally HEART:  RRR.  PMI not displaced or sustained,S1 and S2 within normal limits, no S3, no S4, no clicks, no rubs, no murmurs ABD:  Flat, positive bowel sounds normal in frequency in pitch, no bruits, no rebound, no guarding, no midline pulsatile mass, no hepatomegaly, no splenomegaly EXT:  2 plus pulses throughout, no edema, no cyanosis no clubbing SKIN:  No rashes no nodules NEURO:  Cranial nerves II through XII grossly intact, motor grossly intact throughout PSYCH:  Cognitively intact, oriented to person place and time   ASSESSMENT:    1. Essential hypertension   2. Obesity (BMI 30.0-34.9)     PLAN:    # Hypertension:  # Obesity: Blood pressure is elevated both here and at home.  Her goal is less than 130/80.  She does not have an extensive family history of hypertension.  She is overweight and has multiple ways to modify her lifestyle.  We discussed limiting sodium intake to 1500 mg and increasing her aerobic exercise to 150 minutes weekly.  We also discussed limiting caffeine intake and medications to avoid.  She would like to work on these things before starting any medications.  We will get renal artery Dopplers to assess for renal artery stenosis and check a TSH.  Additionally she her potassium has been as low as 2.9 in the past.  Therefore we will check for hyperaldosteronism with renin and aldosterone levels.  She will work on these things for 3 months and check her blood pressure daily.    Disposition:    FU with MD/PharmD in 3 months   Medication Adjustments/Labs and Tests Ordered: Current medicines are reviewed at length with the patient today.  Concerns regarding medicines are outlined above.  Orders Placed This Encounter  Procedures  . TSH  . Basic metabolic panel  . Aldosterone  + renin activity w/ ratio  . EKG 12-Lead  . VAS US RENAL ARTERY DUPLEX   No orders of the defined types were placed in this encounter.    Signed, Evelyn Latch, MD  03/03/2020 10:51 AM    [name]

## 2020-03-03 ENCOUNTER — Encounter: Payer: Self-pay | Admitting: Cardiovascular Disease

## 2020-03-03 DIAGNOSIS — E66811 Obesity, class 1: Secondary | ICD-10-CM

## 2020-03-03 DIAGNOSIS — E669 Obesity, unspecified: Secondary | ICD-10-CM

## 2020-03-03 DIAGNOSIS — I10 Essential (primary) hypertension: Secondary | ICD-10-CM

## 2020-03-03 HISTORY — DX: Obesity, class 1: E66.811

## 2020-03-03 HISTORY — DX: Obesity, unspecified: E66.9

## 2020-03-03 HISTORY — DX: Essential (primary) hypertension: I10

## 2020-03-03 LAB — BASIC METABOLIC PANEL
BUN/Creatinine Ratio: 11 (ref 9–23)
CO2: 20 mmol/L (ref 20–29)
Creatinine, Ser: 0.66 mg/dL (ref 0.57–1.00)
Glucose: 98 mg/dL (ref 65–99)
Potassium: 4.2 mmol/L (ref 3.5–5.2)
Sodium: 144 mmol/L (ref 134–144)

## 2020-03-03 LAB — TSH: TSH: 0.654 u[IU]/mL (ref 0.450–4.500)

## 2020-03-08 LAB — BASIC METABOLIC PANEL
BUN: 7 mg/dL (ref 6–20)
Calcium: 9.4 mg/dL (ref 8.7–10.2)
Chloride: 107 mmol/L — ABNORMAL HIGH (ref 96–106)
GFR calc Af Amer: 130 mL/min/{1.73_m2} (ref >59)
GFR calc non Af Amer: 112 mL/min/{1.73_m2} (ref >59)

## 2020-03-08 LAB — ALDOSTERONE + RENIN ACTIVITY W/ RATIO
ALDOS/RENIN RATIO: <2.5 (ref 0.0–30.0)
ALDOSTERONE: <1 ng/dL (ref 0.0–30.0)
Renin: 0.395 ng/mL/hr (ref 0.167–5.380)

## 2020-03-11 ENCOUNTER — Ambulatory Visit (HOSPITAL_COMMUNITY)
Admission: RE | Admit: 2020-03-11 | Discharge: 2020-03-11 | Disposition: A | Discharge status: Home / Self Care | Payer: 59 | Source: Ambulatory Visit | Attending: Internal Medicine | Admitting: Internal Medicine

## 2020-03-11 ENCOUNTER — Other Ambulatory Visit: Payer: Self-pay

## 2020-03-11 DIAGNOSIS — I10 Essential (primary) hypertension: Secondary | ICD-10-CM | POA: Diagnosis not present

## 2020-06-01 ENCOUNTER — Ambulatory Visit: Payer: 59 | Admitting: Cardiovascular Disease

## 2020-06-23 ENCOUNTER — Emergency Department (HOSPITAL_BASED_OUTPATIENT_CLINIC_OR_DEPARTMENT_OTHER)
Admission: EM | Admit: 2020-06-23 | Discharge: 2020-06-23 | Disposition: A | Discharge status: Home / Self Care | Payer: 59 | Attending: Emergency Medicine | Admitting: Emergency Medicine

## 2020-06-23 ENCOUNTER — Emergency Department (HOSPITAL_BASED_OUTPATIENT_CLINIC_OR_DEPARTMENT_OTHER): Payer: 59

## 2020-06-23 ENCOUNTER — Encounter (HOSPITAL_BASED_OUTPATIENT_CLINIC_OR_DEPARTMENT_OTHER): Payer: Self-pay | Admitting: *Deleted

## 2020-06-23 ENCOUNTER — Other Ambulatory Visit: Payer: Self-pay

## 2020-06-23 DIAGNOSIS — Y92093 Driveway of other non-institutional residence as the place of occurrence of the external cause: Secondary | ICD-10-CM | POA: Insufficient documentation

## 2020-06-23 DIAGNOSIS — W010XXA Fall on same level from slipping, tripping and stumbling without subsequent striking against object, initial encounter: Secondary | ICD-10-CM | POA: Insufficient documentation

## 2020-06-23 DIAGNOSIS — F17210 Nicotine dependence, cigarettes, uncomplicated: Secondary | ICD-10-CM | POA: Insufficient documentation

## 2020-06-23 DIAGNOSIS — S93402A Sprain of unspecified ligament of left ankle, initial encounter: Secondary | ICD-10-CM | POA: Insufficient documentation

## 2020-06-23 DIAGNOSIS — I10 Essential (primary) hypertension: Secondary | ICD-10-CM | POA: Insufficient documentation

## 2020-06-23 DIAGNOSIS — S99912A Unspecified injury of left ankle, initial encounter: Secondary | ICD-10-CM | POA: Diagnosis present

## 2020-06-23 NOTE — ED Notes (Signed)
Pt refused crutches.

## 2020-06-23 NOTE — ED Triage Notes (Signed)
C/o left foot injury x 1 week ago

## 2020-06-23 NOTE — Discharge Instructions (Addendum)
Your x-rays do not show any evidence of fracture. Please read instructions below. Apply ice to your foot/ankle for 20 minutes at a time. You can continue taking ibuprofen for pain. Schedule an appointment with the sports medicine physician for follow-up on your injury. Return to the ER for new or concerning symptoms.

## 2020-06-23 NOTE — ED Provider Notes (Signed)
[facility] EMERGENCY DEPARTMENT Provider Note   CSN: [id number] Arrival date & time: 06/23/20  1611     History Chief Complaint  Patient presents with  . Foot Injury    Jaclyn Andy Sorci is a 40 y.o. female with PMHx HTN, presenting for evaluation of left ankle injury.  Patient states she slipped on gravel driverway on Friday. She has lateral left ankle pain and bruising. Sunday pain worsened.  Feels like the pain gradually gets better throughout the d with weightbearing though she movement of her ankle and pushing off with walking causes more pain.  Has been treating with ice and ibuprofen.  No prior injury.  No wounds.  The history is provided by the patient.       Past Medical History:  Diagnosis Date  . Essential hypertension 03/03/2020  . Obesity (BMI 30.0-34.9) 03/03/2020  . Scoliosis   . SVD (spontaneous vaginal delivery)    x 2    Patient Active Problem List   Diagnosis Date Noted  . Essential hypertension 03/03/2020  . Obesity (BMI 30.0-34.9) 03/03/2020    Past Surgical History:  Procedure Laterality Date  . BACK SURGERY     rods in lower back  . DILATATION & CURETTAGE/HYSTEROSCOPY WITH MYOSURE N/A 10/27/2015   Procedure: DILATATION & CURETTAGE/HYSTEROSCOPY WITH MYOSURE WITH RESECTION OF FIBROID;  Surgeon: Servando Salina, MD;  Location: [facility];  Service: Gynecology;  Laterality: N/A;  Requests 1 hr.  . WISDOM TOOTH EXTRACTION       OB History   No obstetric history on file.     Family History  Problem Relation Age of Onset  . Hypertension Mother   . Heart failure Father 9  . Diabetes Maternal Grandmother     Social History   Tobacco Use  . Smoking status: Current Every Day Smoker    Packs/day: 0.50    Years: 15.00    Pack years: 7.50    Types: Cigarettes  . Smokeless tobacco: Never Used  Substance Use Topics  . Alcohol use: Yes    Comment: occasional wine 2/month  . Drug use: No    Home Medications Prior to Admission  medications   Medication Sig Start Date End Date Taking? Authorizing Provider  acetaminophen (TYLENOL) 500 MG tablet Take 500 mg by mouth every 6 (six) hours as needed.    [provider]    Allergies    Patient has no known allergies.  Review of Systems   Review of Systems  Musculoskeletal: Positive for arthralgias.  Skin: Positive for color change.  Neurological: Negative for numbness.    Physical Exam Updated Vital Signs BP (!) 175/113 (BP Location: Left Arm)   Pulse 75   Temp 98.5 F (36.9 C) (Oral)   Resp 16   Ht 5\' 2"  (1.575 m)   Wt 73.5 kg   LMP 06/10/2020   SpO2 100%   BMI 29.63 kg/m   Physical Exam Vitals and nursing note reviewed.  Constitutional:      General: She is not in acute distress.    Appearance: She is well-developed.  HENT:     Head: Normocephalic and atraumatic.  Eyes:     Conjunctiva/sclera: Conjunctivae normal.  Cardiovascular:     Rate and Rhythm: Normal rate.  Pulmonary:     Effort: Pulmonary effort is normal.  Musculoskeletal:     Comments: Left ankle and dorsal foot with mild to moderate swelling, bruising is also noted to the lateral ankle and foot.  No pain  with movement of the foot, there is some pain with range of motion of the ankle.  Ankle is stable.  Achilles is intact.  No wounds.  Normal distal sensation and DP pulse  Neurological:     Mental Status: She is alert.  Psychiatric:        Mood and Affect: Mood normal.        Behavior: Behavior normal.     ED Results / Procedures / Treatments   Labs (all labs ordered are listed, but only abnormal results are displayed) Labs Reviewed - No data to display  EKG None  Radiology DG Ankle Complete Left  Result Date: 06/23/2020 CLINICAL DATA:  Left ankle pain after fall. EXAM: LEFT ANKLE COMPLETE - 3+ VIEW COMPARISON:  None. FINDINGS: There is no evidence of fracture, dislocation, or joint effusion. There is no evidence of arthropathy or other focal bone abnormality.  Soft tissues are unremarkable. IMPRESSION: Negative. Electronically Signed   By: Marijo Conception M.D.   On: 06/23/2020 19:26   DG Foot Complete Left  Result Date: 06/23/2020 CLINICAL DATA:  Left foot injury EXAM: LEFT FOOT - COMPLETE 3+ VIEW COMPARISON:  None. FINDINGS: There is no evidence of fracture or dislocation. There is no evidence of arthropathy or other focal bone abnormality. Soft tissues are unremarkable. IMPRESSION: Negative. Electronically Signed   By: Donavan Foil M.D.   On: 06/23/2020 16:54    Procedures Procedures   Medications Ordered in ED Medications - No data to display  ED Course  I have reviewed the triage vital signs and the nursing notes.  Pertinent labs & imaging results that were available during my care of the patient were reviewed by me and considered in my medical decision making (see chart for details).    MDM Rules/Calculators/A&P                          Patient with likely left ankle sprain after slipping and falling on Friday.  She has bruising and swelling noted.  X-rays are negative for acute fracture of the foot or ankle.  She is placed in cam walker boot, crutches, provided sports medicine referral for follow-up as she does not have PCP.  Continue ice, elevation, NSAIDs for pain.  Discussed results, findings, treatment and follow up. Patient advised of return precautions. Patient verbalized understanding and agreed with plan.  Final Clinical Impression(s) / ED Diagnoses Final diagnoses:  Sprain of left ankle, unspecified ligament, initial encounter    Rx / DC Orders ED Discharge Orders    None       Kassidee Narciso, [place] N, PA-C 06/23/20 1953    Hayden Rasmussen, MD 06/24/20 0900

## 2020-06-23 NOTE — ED Notes (Signed)
Pt reports she tripped and fell on 06/18/20 injuring her left foot.  Has been using ice and Ibuprofen since injury.  Swelling has improved but she states it still hurts to step on the foot and bend it.

## 2020-07-11 ENCOUNTER — Other Ambulatory Visit (HOSPITAL_BASED_OUTPATIENT_CLINIC_OR_DEPARTMENT_OTHER): Payer: Self-pay

## 2020-07-11 ENCOUNTER — Emergency Department (HOSPITAL_BASED_OUTPATIENT_CLINIC_OR_DEPARTMENT_OTHER)
Admission: EM | Admit: 2020-07-11 | Discharge: 2020-07-11 | Disposition: A | Discharge status: Home / Self Care | Payer: 59 | Attending: Emergency Medicine | Admitting: Emergency Medicine

## 2020-07-11 ENCOUNTER — Encounter (HOSPITAL_BASED_OUTPATIENT_CLINIC_OR_DEPARTMENT_OTHER): Payer: Self-pay | Admitting: Emergency Medicine

## 2020-07-11 ENCOUNTER — Other Ambulatory Visit: Payer: Self-pay

## 2020-07-11 DIAGNOSIS — F17210 Nicotine dependence, cigarettes, uncomplicated: Secondary | ICD-10-CM | POA: Diagnosis not present

## 2020-07-11 DIAGNOSIS — I10 Essential (primary) hypertension: Secondary | ICD-10-CM | POA: Insufficient documentation

## 2020-07-11 DIAGNOSIS — R21 Rash and other nonspecific skin eruption: Secondary | ICD-10-CM | POA: Diagnosis not present

## 2020-07-11 MED ORDER — PREDNISONE 50 MG PO TABS
50.0000 mg | ORAL_TABLET | Freq: Every day | ORAL | 0 refills | Status: DC
  Filled 2020-07-11: qty 5, 5d supply, fill #0

## 2020-07-11 NOTE — Discharge Instructions (Addendum)
You were seen in the emergency department for evaluation of a rash.  It is unclear what the trigger this rashes.  We are putting you on 5 days of steroids.  You can also use Benadryl for the itching.  Follow-up with your doctor and return to the emergency department if any worsening or concerning symptoms.

## 2020-07-11 NOTE — ED Provider Notes (Signed)
[facility] EMERGENCY DEPARTMENT Provider Note   CSN: [id number] Arrival date & time: 07/11/20  1041     History Chief Complaint  Patient presents with  . Pruritis    [name] is a 40 y.o. female.  She is complaining of an itchy rash that started on her neck and upper chest and now has spread to her entire trunk and arms.  No new medications or other exposures that she can identify.  No shortness of breath fevers cough difficulty swallowing or speaking.  Has tried nothing for it.  The history is provided by the patient.  Rash Location:  Shoulder/arm, torso and leg Quality: itchiness and redness   Quality: not blistering, not draining and not weeping   Severity:  Moderate Onset quality:  Gradual Timing:  Constant Progression:  Worsening Chronicity:  New Relieved by:  None tried Worsened by:  Nothing Ineffective treatments:  None tried Associated symptoms: no abdominal pain, no fever, no shortness of breath, no throat swelling and not vomiting        Past Medical History:  Diagnosis Date  . Essential hypertension 03/03/2020  . Obesity (BMI 30.0-34.9) 03/03/2020  . Scoliosis   . SVD (spontaneous vaginal delivery)    x 2    Patient Active Problem List   Diagnosis Date Noted  . Essential hypertension 03/03/2020  . Obesity (BMI 30.0-34.9) 03/03/2020    Past Surgical History:  Procedure Laterality Date  . BACK SURGERY     rods in lower back  . DILATATION & CURETTAGE/HYSTEROSCOPY WITH MYOSURE N/A 10/27/2015   Procedure: DILATATION & CURETTAGE/HYSTEROSCOPY WITH MYOSURE WITH RESECTION OF FIBROID;  Surgeon: Servando Salina, MD;  Location: [facility];  Service: Gynecology;  Laterality: N/A;  Requests 1 hr.  . WISDOM TOOTH EXTRACTION       OB History   No obstetric history on file.     Family History  Problem Relation Age of Onset  . Hypertension Mother   . Heart failure Father 13  . Diabetes Maternal Grandmother     Social History   Tobacco Use   . Smoking status: Current Every Day Smoker    Packs/day: 0.50    Years: 15.00    Pack years: 7.50    Types: Cigarettes  . Smokeless tobacco: Never Used  Substance Use Topics  . Alcohol use: Yes    Comment: occasional wine 2/month  . Drug use: No    Home Medications Prior to Admission medications   Medication Sig Start Date End Date Taking? Authorizing Provider  acetaminophen (TYLENOL) 500 MG tablet Take 500 mg by mouth every 6 (six) hours as needed.    [provider]    Allergies    Patient has no known allergies.  Review of Systems   Review of Systems  Constitutional: Negative for fever.  Respiratory: Negative for shortness of breath.   Gastrointestinal: Negative for abdominal pain and vomiting.  Skin: Positive for rash.    Physical Exam Updated Vital Signs BP (!) 159/105 (BP Location: Right Arm)   Pulse 78   Temp 98.9 F (37.2 C) (Oral)   Resp 18   Ht 5\' 1"  (1.549 m)   Wt 73.9 kg   LMP 07/06/2020   SpO2 100%   BMI 30.80 kg/m   Physical Exam Vitals and nursing note reviewed.  Constitutional:      Appearance: Normal appearance. She is well-developed.  HENT:     Head: Normocephalic and atraumatic.  Eyes:     Conjunctiva/sclera:  Conjunctivae normal.  Cardiovascular:     Rate and Rhythm: Normal rate and regular rhythm.     Pulses: Normal pulses.  Pulmonary:     Effort: Pulmonary effort is normal.     Breath sounds: Normal breath sounds.  Musculoskeletal:        General: No deformity or signs of injury. Normal range of motion.     Cervical back: Neck supple.  Skin:    General: Skin is warm and dry.     Findings: Rash present.     Comments: She has areas of urticaria and a macular red rash that becomes confluent in areas over her trunk and upper arms and her anterior neck.  Neurological:     General: No focal deficit present.     Mental Status: She is alert.     GCS: GCS eye subscore is 4. GCS verbal subscore is 5. GCS motor subscore is 6.      ED Results / Procedures / Treatments   Labs (all labs ordered are listed, but only abnormal results are displayed) Labs Reviewed - No data to display  EKG None  Radiology No results found.  Procedures Procedures   Medications Ordered in ED Medications - No data to display  ED Course  I have reviewed the triage vital signs and the nursing notes.  Pertinent labs & imaging results that were available during my care of the patient were reviewed by me and considered in my medical decision making (see chart for details).    MDM Rules/Calculators/A&P                          Patient here with generalized pruritus and signs of urticarial rash.  No clear precipitant.  No signs of systemic illness or involvement. Final Clinical Impression(s) / ED Diagnoses Final diagnoses:  Rash and nonspecific skin eruption    Rx / DC Orders ED Discharge Orders         Ordered    predniSONE (DELTASONE) 50 MG tablet  Daily        07/11/20 1154           Hayden Rasmussen, MD 07/11/20 1755

## 2020-07-11 NOTE — ED Triage Notes (Signed)
All over rash, itching . No known allergen

## 2020-11-15 ENCOUNTER — Encounter: Payer: Self-pay | Admitting: Family Medicine

## 2020-11-15 ENCOUNTER — Ambulatory Visit (INDEPENDENT_AMBULATORY_CARE_PROVIDER_SITE_OTHER): Payer: 59 | Admitting: Family Medicine

## 2020-11-15 ENCOUNTER — Other Ambulatory Visit: Payer: Self-pay

## 2020-11-15 VITALS — BP 144/96 | HR 78 | Temp 99.0°F | Resp 16 | Ht 62.75 in | Wt 169.6 lb

## 2020-11-15 DIAGNOSIS — Z7689 Persons encountering health services in other specified circumstances: Secondary | ICD-10-CM | POA: Diagnosis not present

## 2020-11-15 DIAGNOSIS — R030 Elevated blood-pressure reading, without diagnosis of hypertension: Secondary | ICD-10-CM

## 2020-11-15 DIAGNOSIS — J0140 Acute pansinusitis, unspecified: Secondary | ICD-10-CM | POA: Insufficient documentation

## 2020-11-15 DIAGNOSIS — L732 Hidradenitis suppurativa: Secondary | ICD-10-CM | POA: Insufficient documentation

## 2020-11-15 DIAGNOSIS — R21 Rash and other nonspecific skin eruption: Secondary | ICD-10-CM | POA: Diagnosis not present

## 2020-11-15 MED ORDER — LOSARTAN POTASSIUM 25 MG PO TABS: 25.0000 mg | ORAL_TABLET | Freq: Every day | ORAL | 1 refills | Status: DC

## 2020-11-15 MED ORDER — TRIAMCINOLONE ACETONIDE 0.1 % EX CREA: 1.0000 "application " | TOPICAL_CREAM | Freq: Two times a day (BID) | CUTANEOUS | 0 refills | Status: DC

## 2020-11-15 NOTE — Progress Notes (Signed)
New Patient Office Visit  Subjective:  Patient ID: [name], female    DOB: November 27, 1980  Age: 40 y.o. MRN: [id number]  CC:  Chief Complaint  Patient presents with   Establish Care    HPI [name] presents for to establish care. Patient also reports that she has had elevated blood pressures noted in the past. Also reports rash on arm but had shingles in the past and sx seem to be different as well as a different area.   Past Medical History:  Diagnosis Date   Essential hypertension 03/03/2020   Obesity (BMI 30.0-34.9) 03/03/2020   Scoliosis    SVD (spontaneous vaginal delivery)    x 2    Past Surgical History:  Procedure Laterality Date   BACK SURGERY     rods in lower back   DILATATION & CURETTAGE/HYSTEROSCOPY WITH MYOSURE N/A 10/27/2015   Procedure: DILATATION & CURETTAGE/HYSTEROSCOPY WITH MYOSURE WITH RESECTION OF FIBROID;  Surgeon: Evelyn Better, MD;  Location: [facility];  Service: Gynecology;  Laterality: N/A;  Requests 1 hr.   WISDOM TOOTH EXTRACTION      Family History  Problem Relation Age of Onset   Hypertension Mother    Heart failure Father 88   Diabetes Maternal Grandmother     Social History   Socioeconomic History   Marital status: Single    Spouse name: Not on file   Number of children: Not on file   Years of education: Not on file   Highest education level: Not on file  Occupational History   Not on file  Tobacco Use   Smoking status: Former    Packs/day: 0.50    Years: 15.00    Pack years: 7.50    Types: Cigarettes    Quit date: 07/06/2020    Years since quitting: 0.3   Smokeless tobacco: Never  Substance and Sexual Activity   Alcohol use: Yes    Comment: occasional wine 2/month   Drug use: No   Sexual activity: Yes    Birth control/protection: Implant    Comment: implanon  Other Topics Concern   Not on file  Social History Narrative   Not on file   Social Determinants of Health   Financial Resource Strain: Not on file   Food Insecurity: Not on file  Transportation Needs: Not on file  Physical Activity: Not on file  Stress: Not on file  Social Connections: Not on file  Intimate Partner Violence: Not on file    ROS Review of Systems  Cardiovascular: Negative.   Skin:  Positive for rash.  All other systems reviewed and are negative.  Objective:   Today's Vitals: BP (!) 144/96   Pulse 78   Temp 99 F (37.2 C) (Oral)   Resp 16   Ht 5' 2.75" (1.594 m)   Wt 169 lb 9.6 oz (76.9 kg)   SpO2 96%   BMI 30.28 kg/m   Physical Exam Vitals and nursing note reviewed.  Constitutional:      General: She is not in acute distress. Cardiovascular:     Rate and Rhythm: Normal rate and regular rhythm.  Pulmonary:     Effort: Pulmonary effort is normal.     Breath sounds: Normal breath sounds.  Abdominal:     Palpations: Abdomen is soft.     Tenderness: There is no abdominal tenderness.  Musculoskeletal:     Right lower leg: No edema.     Left lower leg: No edema.  Skin:  Findings: Rash (right lateral upper arm) present.  Neurological:     General: No focal deficit present.     Mental Status: She is alert and oriented to person, place, and time.    Assessment & Plan:   1. Elevated blood pressure reading in office without diagnosis of hypertension Elevated reading. Monitoring labs ordered. Losartan 25 mg daily prescribed. Will monitor - CMP14+EGFR - Lipid Panel  2. Rash and nonspecific skin eruption Kenalog cream prescribed  3. Encounter to establish care    Outpatient Encounter Medications as of 11/15/2020  Medication Sig   losartan (COZAAR) 25 MG tablet Take 1 tablet (25 mg total) by mouth daily.   triamcinolone cream (KENALOG) 0.1 % Apply 1 application topically 2 (two) times daily.   acetaminophen (TYLENOL) 500 MG tablet Take 500 mg by mouth every 6 (six) hours as needed.   [DISCONTINUED] predniSONE (DELTASONE) 50 MG tablet Take 1 tablet (50 mg total) by mouth daily.   No  facility-administered encounter medications on file as of 11/15/2020.    Follow-up: Return in about 2 weeks (around 11/29/2020) for follow up.   Evelyn Sax, MD

## 2020-11-15 NOTE — Progress Notes (Signed)
Patient is here to est care. Patient has no concerns for provider

## 2020-11-16 LAB — CMP14+EGFR
ALT: 9 IU/L (ref 0–32)
AST: 12 IU/L (ref 0–40)
Albumin/Globulin Ratio: 1.9 (ref 1.2–2.2)
Albumin: 4.8 g/dL (ref 3.8–4.8)
Alkaline Phosphatase: 63 IU/L (ref 44–121)
BUN/Creatinine Ratio: 9 (ref 9–23)
BUN: 7 mg/dL (ref 6–20)
Bilirubin Total: 0.3 mg/dL (ref 0.0–1.2)
CO2: 22 mmol/L (ref 20–29)
Calcium: 9.3 mg/dL (ref 8.7–10.2)
Chloride: 102 mmol/L (ref 96–106)
Creatinine, Ser: 0.75 mg/dL (ref 0.57–1.00)
Globulin, Total: 2.5 g/dL (ref 1.5–4.5)
Glucose: 90 mg/dL (ref 70–99)
Potassium: 4.8 mmol/L (ref 3.5–5.2)
Sodium: 138 mmol/L (ref 134–144)
Total Protein: 7.3 g/dL (ref 6.0–8.5)
eGFR: 104 mL/min/{1.73_m2} (ref >59)

## 2020-11-16 LAB — LIPID PANEL
Chol/HDL Ratio: 3.7 ratio (ref 0.0–4.4)
Cholesterol, Total: 198 mg/dL (ref 100–199)
HDL: 54 mg/dL (ref >39)
LDL Chol Calc (NIH): 126 mg/dL — ABNORMAL HIGH (ref 0–99)
Triglycerides: 103 mg/dL (ref 0–149)
VLDL Cholesterol Cal: 18 mg/dL (ref 5–40)

## 2020-11-28 ENCOUNTER — Telehealth: Payer: Self-pay | Admitting: Family Medicine

## 2020-11-28 NOTE — Telephone Encounter (Signed)
Patient called requesting to know the status of her Biometric form. Patient states she brought the form on her last visit and her employer is waiting for the form.   Please f/u

## 2020-11-28 NOTE — Telephone Encounter (Signed)
Form has been faxed.

## 2020-12-01 ENCOUNTER — Encounter: Payer: Self-pay | Admitting: Nurse Practitioner

## 2020-12-01 ENCOUNTER — Ambulatory Visit (INDEPENDENT_AMBULATORY_CARE_PROVIDER_SITE_OTHER): Payer: 59 | Admitting: Nurse Practitioner

## 2020-12-01 ENCOUNTER — Ambulatory Visit: Payer: 59 | Admitting: Family Medicine

## 2020-12-01 ENCOUNTER — Other Ambulatory Visit: Payer: Self-pay

## 2020-12-01 DIAGNOSIS — I10 Essential (primary) hypertension: Secondary | ICD-10-CM | POA: Diagnosis not present

## 2020-12-01 NOTE — Progress Notes (Signed)
Virtual Visit via Telephone Note  I connected with [name] on 12/01/20 at  9:40 AM EDT by telephone and verified that I am speaking with the correct person using two identifiers.  Location: Patient: home Provider: office   I discussed the limitations, risks, security and privacy concerns of performing an evaluation and management service by telephone and the availability of in person appointments. I also discussed with the patient that there may be a patient responsible charge related to this service. The patient expressed understanding and agreed to proceed.   History of Present Illness:  Patient presents today for follow-up on blood pressure.  She was last seen by Dr. Redmond Pulling a couple weeks ago and started on losartan 25 mg daily.  She states that overall she has been doing well and her blood pressures have been within normal range.  She states that at times she feels like her blood sugar is dropping.  She has not checked her blood pressure during the spells.  We discussed that this could be her blood pressure dropping too low.  Patient is trying to improve her diet.  She is compliant with medication. Denies f/c/s, n/v/d, hemoptysis, PND, chest pain or edema.    Observations/Objective:  Patient reported blood pressure this am is 133/97 and Heart rate 86.  Vitals with BMI 11/15/2020 07/11/2020 06/23/2020  Height 5' 2.75" 5\' 1"  -  Weight 169 lbs 10 oz 163 lbs -  BMI 12.19 75.88 -  Systolic 325 498 264  Diastolic 96 158 89  Pulse 78 78 72      Assessment and Plan:  Patient Instructions  Hypertension:  Continue current medications  Low sodium diet - eat 6 small meals throughout the day - high protein/low carb to keep blood sugar stable  Stay well hydrated  Keep blood pressure log   Follow up:  Follow up in 8 weeks with Dr. Redmond Pulling   Managing Your Hypertension Hypertension, also called high blood pressure, is when the force of the blood pressing against the walls of  the arteries is too strong. Arteries are blood vessels that carry blood from your heart throughout your body. Hypertension forces the heart to work harder to pump blood and may cause the arteries to become narrow or stiff. Understanding blood pressure readings Your personal target blood pressure may vary depending on your medical conditions, your age, and other factors. A blood pressure reading includes a higher number over a lower number. Ideally, your blood pressure should be below 120/80. You should know that: The first, or top, number is called the systolic pressure. It is a measure of the pressure in your arteries as your heart beats. The second, or bottom number, is called the diastolic pressure. It is a measure of the pressure in your arteries as the heart relaxes. Blood pressure is classified into four stages. Based on your blood pressure reading, your health care provider may use the following stages to determine what type of treatment you need, if any. Systolic pressure and diastolic pressure are measured in a unit called mmHg. Normal Systolic pressure: below 309. Diastolic pressure: below 80. Elevated Systolic pressure: 407-680. Diastolic pressure: below 80. Hypertension stage 1 Systolic pressure: 881-103. Diastolic pressure: 15-94. Hypertension stage 2 Systolic pressure: 585 or above. Diastolic pressure: 90 or above. How can this condition affect me? Managing your hypertension is an important responsibility. Over time, hypertension can damage the arteries and decrease blood flow to important parts of the body, including the brain, heart, and  kidneys. Having untreated or uncontrolled hypertension can lead to: A heart attack. A stroke. A weakened blood vessel (aneurysm). Heart failure. Kidney damage. Eye damage. Metabolic syndrome. Memory and concentration problems. Vascular dementia. What actions can I take to manage this condition? Hypertension can be managed by making  lifestyle changes and possibly by taking medicines. Your health care provider will help you make a plan to bring your blood pressure within a normal range. Nutrition  Eat a diet that is high in fiber and potassium, and low in salt (sodium), added sugar, and fat. An example eating plan is called the Dietary Approaches to Stop Hypertension (DASH) diet. To eat this way: Eat plenty of fresh fruits and vegetables. Try to fill one-half of your plate at each meal with fruits and vegetables. Eat whole grains, such as whole-wheat pasta, brown rice, or whole-grain bread. Fill about one-fourth of your plate with whole grains. Eat low-fat dairy products. Avoid fatty cuts of meat, processed or cured meats, and poultry with skin. Fill about one-fourth of your plate with lean proteins such as fish, chicken without skin, beans, eggs, and tofu. Avoid pre-made and processed foods. These tend to be higher in sodium, added sugar, and fat. Reduce your daily sodium intake. Most people with hypertension should eat less than 1,500 mg of sodium a day. Lifestyle  Work with your health care provider to maintain a healthy body weight or to lose weight. Ask what an ideal weight is for you. Get at least 30 minutes of exercise that causes your heart to beat faster (aerobic exercise) most days of the week. Activities may include walking, swimming, or biking. Include exercise to strengthen your muscles (resistance exercise), such as weight lifting, as part of your weekly exercise routine. Try to do these types of exercises for 30 minutes at least 3 days a week. Do not use any products that contain nicotine or tobacco, such as cigarettes, e-cigarettes, and chewing tobacco. If you need help quitting, ask your health care provider. Control any long-term (chronic) conditions you have, such as high cholesterol or diabetes. Identify your sources of stress and find ways to manage stress. This may include meditation, deep breathing, or  making time for fun activities. Alcohol use Do not drink alcohol if: Your health care provider tells you not to drink. You are pregnant, may be pregnant, or are planning to become pregnant. If you drink alcohol: Limit how much you use to: 0-1 drink a day for women. 0-2 drinks a day for men. Be aware of how much alcohol is in your drink. In the [place] one drink equals one 12 oz bottle of beer (355 mL), one 5 oz glass of wine (148 mL), or one 1 oz glass of hard liquor (44 mL). Medicines Your health care provider may prescribe medicine if lifestyle changes are not enough to get your blood pressure under control and if: Your systolic blood pressure is 130 or higher. Your diastolic blood pressure is 80 or higher. Take medicines only as told by your health care provider. Follow the directions carefully. Blood pressure medicines must be taken as told by your health care provider. The medicine does not work as well when you skip doses. Skipping doses also puts you at risk for problems. Monitoring Before you monitor your blood pressure: Do not smoke, drink caffeinated beverages, or exercise within 30 minutes before taking a measurement. Use the bathroom and empty your bladder (urinate). Sit quietly for at least 5 minutes before taking measurements. Monitor your  blood pressure at home as told by your health care provider. To do this: Sit with your back straight and supported. Place your feet flat on the floor. Do not cross your legs. Support your arm on a flat surface, such as a table. Make sure your upper arm is at heart level. Each time you measure, take two or three readings one minute apart and record the results. You may also need to have your blood pressure checked regularly by your health care provider. General information Talk with your health care provider about your diet, exercise habits, and other lifestyle factors that may be contributing to hypertension. Review all the medicines you  take with your health care provider because there may be side effects or interactions. Keep all visits as told by your health care provider. Your health care provider can help you create and adjust your plan for managing your high blood pressure. Where to find more information National Heart, Lung, and Blood Institute: https://wilson-eaton.com/ American Heart Association: www.heart.org Contact a health care provider if: You think you are having a reaction to medicines you have taken. You have repeated (recurrent) headaches. You feel dizzy. You have swelling in your ankles. You have trouble with your vision. Get help right away if: You develop a severe headache or confusion. You have unusual weakness or numbness, or you feel faint. You have severe pain in your chest or abdomen. You vomit repeatedly. You have trouble breathing. These symptoms may represent a serious problem that is an emergency. Do not wait to see if the symptoms will go away. Get medical help right away. Call your local emergency services (911 in the [place] Do not drive yourself to the hospital. Summary Hypertension is when the force of blood pumping through your arteries is too strong. If this condition is not controlled, it may put you at risk for serious complications. Your personal target blood pressure may vary depending on your medical conditions, your age, and other factors. For most people, a normal blood pressure is less than 120/80. Hypertension is managed by lifestyle changes, medicines, or both. Lifestyle changes to help manage hypertension include losing weight, eating a healthy, low-sodium diet, exercising more, stopping smoking, and limiting alcohol. This information is not intended to replace advice given to you by your health care provider. Make sure you discuss any questions you have with your health care provider. Document Revised: 02/27/2019 Document Reviewed: 12/23/2018 Elsevier Patient Education  2022 [place].  Blood Pressure Record Sheet To take your blood pressure, you will need a blood pressure machine. You can buy a blood pressure machine (blood pressure monitor) at your clinic, drug store, or online. When choosing one, consider: An automatic monitor that has an arm cuff. A cuff that wraps snugly around your upper arm. You should be able to fit only one finger between your arm and the cuff. A device that stores blood pressure reading results. Do not choose a monitor that measures your blood pressure from your wrist or finger. Follow your health care provider's instructions for how to take your blood pressure. To use this form: Get one reading in the morning (a.m.) before you take any medicines. Get one reading in the evening (p.m.) before supper. Take at least 2 readings with each blood pressure check. This makes sure the results are correct. Wait 1-2 minutes between measurements. Write down the results in the spaces on this form. Repeat this once a week, or as told by your health care provider. Make a  follow-up appointment with your health care provider to discuss the results. Blood pressure log Date: _______________________ a.m. _____________________(1st reading) _____________________(2nd reading) p.m. _____________________(1st reading) _____________________(2nd reading) Date: _______________________ a.m. _____________________(1st reading) _____________________(2nd reading) p.m. _____________________(1st reading) _____________________(2nd reading) Date: _______________________ a.m. _____________________(1st reading) _____________________(2nd reading) p.m. _____________________(1st reading) _____________________(2nd reading) Date: _______________________ a.m. _____________________(1st reading) _____________________(2nd reading) p.m. _____________________(1st reading) _____________________(2nd reading) Date: _______________________ a.m. _____________________(1st reading)  _____________________(2nd reading) p.m. _____________________(1st reading) _____________________(2nd reading) This information is not intended to replace advice given to you by your health care provider. Make sure you discuss any questions you have with your health care provider. Document Revised: 05/13/2019 Document Reviewed: 05/13/2019 Elsevier Patient Education  2022 [employer].      I discussed the assessment and treatment plan with the patient. The patient was provided an opportunity to ask questions and all were answered. The patient agreed with the plan and demonstrated an understanding of the instructions.   The patient was advised to call back or seek an in-person evaluation if the symptoms worsen or if the condition fails to improve as anticipated.  I provided 23 minutes of non-face-to-face time during this encounter.   Fenton Foy, NP

## 2020-12-01 NOTE — Patient Instructions (Addendum)
Hypertension:  Continue current medications  Low sodium diet - eat 6 small meals throughout the day - high protein/low carb to keep blood sugar stable  Stay well hydrated  Keep blood pressure log   Follow up:  Follow up in 8 weeks with Dr. Redmond Pulling   Managing Your Hypertension Hypertension, also called high blood pressure, is when the force of the blood pressing against the walls of the arteries is too strong. Arteries are blood vessels that carry blood from your heart throughout your body. Hypertension forces the heart to work harder to pump blood and may cause the arteries to become narrow or stiff. Understanding blood pressure readings Your personal target blood pressure may vary depending on your medical conditions, your age, and other factors. A blood pressure reading includes a higher number over a lower number. Ideally, your blood pressure should be below 120/80. You should know that: The first, or top, number is called the systolic pressure. It is a measure of the pressure in your arteries as your heart beats. The second, or bottom number, is called the diastolic pressure. It is a measure of the pressure in your arteries as the heart relaxes. Blood pressure is classified into four stages. Based on your blood pressure reading, your health care provider may use the following stages to determine what type of treatment you need, if any. Systolic pressure and diastolic pressure are measured in a unit called mmHg. Normal Systolic pressure: below 400. Diastolic pressure: below 80. Elevated Systolic pressure: 867-619. Diastolic pressure: below 80. Hypertension stage 1 Systolic pressure: 509-326. Diastolic pressure: 71-24. Hypertension stage 2 Systolic pressure: 580 or above. Diastolic pressure: 90 or above. How can this condition affect me? Managing your hypertension is an important responsibility. Over time, hypertension can damage the arteries and decrease blood flow to important  parts of the body, including the brain, heart, and kidneys. Having untreated or uncontrolled hypertension can lead to: A heart attack. A stroke. A weakened blood vessel (aneurysm). Heart failure. Kidney damage. Eye damage. Metabolic syndrome. Memory and concentration problems. Vascular dementia. What actions can I take to manage this condition? Hypertension can be managed by making lifestyle changes and possibly by taking medicines. Your health care provider will help you make a plan to bring your blood pressure within a normal range. Nutrition  Eat a diet that is high in fiber and potassium, and low in salt (sodium), added sugar, and fat. An example eating plan is called the Dietary Approaches to Stop Hypertension (DASH) diet. To eat this way: Eat plenty of fresh fruits and vegetables. Try to fill one-half of your plate at each meal with fruits and vegetables. Eat whole grains, such as whole-wheat pasta, brown rice, or whole-grain bread. Fill about one-fourth of your plate with whole grains. Eat low-fat dairy products. Avoid fatty cuts of meat, processed or cured meats, and poultry with skin. Fill about one-fourth of your plate with lean proteins such as fish, chicken without skin, beans, eggs, and tofu. Avoid pre-made and processed foods. These tend to be higher in sodium, added sugar, and fat. Reduce your daily sodium intake. Most people with hypertension should eat less than 1,500 mg of sodium a day. Lifestyle  Work with your health care provider to maintain a healthy body weight or to lose weight. Ask what an ideal weight is for you. Get at least 30 minutes of exercise that causes your heart to beat faster (aerobic exercise) most days of the week. Activities may include walking, swimming, or  biking. Include exercise to strengthen your muscles (resistance exercise), such as weight lifting, as part of your weekly exercise routine. Try to do these types of exercises for 30 minutes at least  3 days a week. Do not use any products that contain nicotine or tobacco, such as cigarettes, e-cigarettes, and chewing tobacco. If you need help quitting, ask your health care provider. Control any long-term (chronic) conditions you have, such as high cholesterol or diabetes. Identify your sources of stress and find ways to manage stress. This may include meditation, deep breathing, or making time for fun activities. Alcohol use Do not drink alcohol if: Your health care provider tells you not to drink. You are pregnant, may be pregnant, or are planning to become pregnant. If you drink alcohol: Limit how much you use to: 0-1 drink a day for women. 0-2 drinks a day for men. Be aware of how much alcohol is in your drink. In the [place] one drink equals one 12 oz bottle of beer (355 mL), one 5 oz glass of wine (148 mL), or one 1 oz glass of hard liquor (44 mL). Medicines Your health care provider may prescribe medicine if lifestyle changes are not enough to get your blood pressure under control and if: Your systolic blood pressure is 130 or higher. Your diastolic blood pressure is 80 or higher. Take medicines only as told by your health care provider. Follow the directions carefully. Blood pressure medicines must be taken as told by your health care provider. The medicine does not work as well when you skip doses. Skipping doses also puts you at risk for problems. Monitoring Before you monitor your blood pressure: Do not smoke, drink caffeinated beverages, or exercise within 30 minutes before taking a measurement. Use the bathroom and empty your bladder (urinate). Sit quietly for at least 5 minutes before taking measurements. Monitor your blood pressure at home as told by your health care provider. To do this: Sit with your back straight and supported. Place your feet flat on the floor. Do not cross your legs. Support your arm on a flat surface, such as a table. Make sure your upper arm is at  heart level. Each time you measure, take two or three readings one minute apart and record the results. You may also need to have your blood pressure checked regularly by your health care provider. General information Talk with your health care provider about your diet, exercise habits, and other lifestyle factors that may be contributing to hypertension. Review all the medicines you take with your health care provider because there may be side effects or interactions. Keep all visits as told by your health care provider. Your health care provider can help you create and adjust your plan for managing your high blood pressure. Where to find more information National Heart, Lung, and Blood Institute: https://wilson-eaton.com/ American Heart Association: www.heart.org Contact a health care provider if: You think you are having a reaction to medicines you have taken. You have repeated (recurrent) headaches. You feel dizzy. You have swelling in your ankles. You have trouble with your vision. Get help right away if: You develop a severe headache or confusion. You have unusual weakness or numbness, or you feel faint. You have severe pain in your chest or abdomen. You vomit repeatedly. You have trouble breathing. These symptoms may represent a serious problem that is an emergency. Do not wait to see if the symptoms will go away. Get medical help right away. Call your local emergency services (  911 in the [place] Do not drive yourself to the hospital. Summary Hypertension is when the force of blood pumping through your arteries is too strong. If this condition is not controlled, it may put you at risk for serious complications. Your personal target blood pressure may vary depending on your medical conditions, your age, and other factors. For most people, a normal blood pressure is less than 120/80. Hypertension is managed by lifestyle changes, medicines, or both. Lifestyle changes to help manage hypertension  include losing weight, eating a healthy, low-sodium diet, exercising more, stopping smoking, and limiting alcohol. This information is not intended to replace advice given to you by your health care provider. Make sure you discuss any questions you have with your health care provider. Document Revised: 02/27/2019 Document Reviewed: 12/23/2018 Elsevier Patient Education  2022 [place].  Blood Pressure Record Sheet To take your blood pressure, you will need a blood pressure machine. You can buy a blood pressure machine (blood pressure monitor) at your clinic, drug store, or online. When choosing one, consider: An automatic monitor that has an arm cuff. A cuff that wraps snugly around your upper arm. You should be able to fit only one finger between your arm and the cuff. A device that stores blood pressure reading results. Do not choose a monitor that measures your blood pressure from your wrist or finger. Follow your health care provider's instructions for how to take your blood pressure. To use this form: Get one reading in the morning (a.m.) before you take any medicines. Get one reading in the evening (p.m.) before supper. Take at least 2 readings with each blood pressure check. This makes sure the results are correct. Wait 1-2 minutes between measurements. Write down the results in the spaces on this form. Repeat this once a week, or as told by your health care provider. Make a follow-up appointment with your health care provider to discuss the results. Blood pressure log Date: _______________________ a.m. _____________________(1st reading) _____________________(2nd reading) p.m. _____________________(1st reading) _____________________(2nd reading) Date: _______________________ a.m. _____________________(1st reading) _____________________(2nd reading) p.m. _____________________(1st reading) _____________________(2nd reading) Date: _______________________ a.m.  _____________________(1st reading) _____________________(2nd reading) p.m. _____________________(1st reading) _____________________(2nd reading) Date: _______________________ a.m. _____________________(1st reading) _____________________(2nd reading) p.m. _____________________(1st reading) _____________________(2nd reading) Date: _______________________ a.m. _____________________(1st reading) _____________________(2nd reading) p.m. _____________________(1st reading) _____________________(2nd reading) This information is not intended to replace advice given to you by your health care provider. Make sure you discuss any questions you have with your health care provider. Document Revised: 05/13/2019 Document Reviewed: 05/13/2019 Elsevier Patient Education  [name].

## 2021-01-08 ENCOUNTER — Other Ambulatory Visit: Payer: Self-pay | Admitting: Family Medicine

## 2021-03-19 ENCOUNTER — Other Ambulatory Visit: Payer: Self-pay | Admitting: Family Medicine

## 2021-04-21 ENCOUNTER — Other Ambulatory Visit: Payer: Self-pay | Admitting: Family Medicine

## 2021-05-21 ENCOUNTER — Other Ambulatory Visit: Payer: Self-pay | Admitting: Family Medicine

## 2021-05-23 MED ORDER — LOSARTAN POTASSIUM 25 MG PO TABS: ORAL_TABLET | ORAL | 0 refills | Status: DC

## 2021-05-23 NOTE — Telephone Encounter (Signed)
Pt called to schedule appt for 4.27.23 due to refill being denied / please advise  ?

## 2021-05-23 NOTE — Addendum Note (Signed)
Addended by: Elliot Cousin on: 05/23/2021 10:03 AM ? ? Modules accepted: Orders ? ?

## 2021-05-23 NOTE — Telephone Encounter (Signed)
Courtesy refill. Pt has appt 4/27/2 ?Requested Prescriptions  ?Pending Prescriptions Disp Refills  ?? losartan (COZAAR) 25 MG tablet 30 tablet 0  ?  Sig: TAKE 1 TABLET(25 MG) BY MOUTH DAILY  ?  ? Cardiovascular:  Angiotensin Receptor Blockers Failed - 05/23/2021 10:03 AM  ?  ?  Failed - Cr in normal range and within 180 days  ?  Creatinine, Ser  ?Date Value Ref Range Status  ?11/15/2020 0.75 0.57 - 1.00 mg/dL Final  ?   ?  ?  Failed - K in normal range and within 180 days  ?  Potassium  ?Date Value Ref Range Status  ?11/15/2020 4.8 3.5 - 5.2 mmol/L Final  ?   ?  ?  Failed - Last BP in normal range  ?  BP Readings from Last 1 Encounters:  ?11/15/20 (!) 144/96  ?   ?  ?  Failed - Valid encounter within last 6 months  ?  Recent Outpatient Visits   ?      ? 5 months ago Essential hypertension  ? Primary Care at [facility], Kriste Basque, NP  ? 6 months ago Elevated blood pressure reading in office without diagnosis of hypertension  ? Primary Care at [facility], MD  ?  ?  ?Future Appointments   ?        ? In 1 week Dorna Mai, MD Primary Care at [facility]  ?  ? ?  ?  ?  Passed - Patient is not pregnant  ?  ?  ?Refused Prescriptions Disp Refills  ?? losartan (COZAAR) 25 MG tablet [Pharmacy Med Name: LOSARTAN '25MG'$  TABLETS] 30 tablet 0  ?  Sig: TAKE 1 TABLET(25 MG) BY MOUTH DAILY  ?  ? Cardiovascular:  Angiotensin Receptor Blockers Failed - 05/23/2021 10:03 AM  ?  ?  Failed - Cr in normal range and within 180 days  ?  Creatinine, Ser  ?Date Value Ref Range Status  ?11/15/2020 0.75 0.57 - 1.00 mg/dL Final  ?   ?  ?  Failed - K in normal range and within 180 days  ?  Potassium  ?Date Value Ref Range Status  ?11/15/2020 4.8 3.5 - 5.2 mmol/L Final  ?   ?  ?  Failed - Last BP in normal range  ?  BP Readings from Last 1 Encounters:  ?11/15/20 (!) 144/96  ?   ?  ?  Failed - Valid encounter within last 6 months  ?  Recent Outpatient Visits   ?      ? 5 months ago Essential hypertension  ? Primary  Care at [facility], Kriste Basque, NP  ? 6 months ago Elevated blood pressure reading in office without diagnosis of hypertension  ? Primary Care at [facility], MD  ?  ?  ?Future Appointments   ?        ? In 1 week Dorna Mai, MD Primary Care at [facility]  ?  ? ?  ?  ?  Passed - Patient is not pregnant  ?  ?  ? ? ?

## 2021-05-23 NOTE — Telephone Encounter (Signed)
Reached pt, aware of courtesy refill. Advised to keep upcoming appt. ?

## 2021-05-29 NOTE — Progress Notes (Signed)
? ? ?Patient ID: [name], female    DOB: [birth year]  MRN: [id number] ? ?CC: Hypertension Follow-Up ? ?Subjective: ?[name] is a 41 y.o. female who presents for hypertension follow-up.  ? ?Her concerns today include:  ?Hypertension follow-up: ?Last appointment 12/01/2020 with Lazaro Arms, NP and Losartan continued at that time. Today reports doing well on current regimen, no issues/concerns. Taking blood pressure medication as prescribed. May miss one or two days here and there with blood pressure medication. Trying to monitor salt intake and exercise when able to do so. Denies red flag symptoms such as but not limited to chest pain, shortness of breath, and worst headache of life.  ? ?Patient Active Problem List  ? Diagnosis Date Noted  ? Acute pansinusitis 11/15/2020  ? Hidradenitis 11/15/2020  ? Essential hypertension 03/03/2020  ? Obesity (BMI 30.0-34.9) 03/03/2020  ?  ? ?Current Outpatient Medications on File Prior to Visit  ?Medication Sig Dispense Refill  ? acetaminophen (TYLENOL) 500 MG tablet Take 500 mg by mouth every 6 (six) hours as needed.    ? triamcinolone cream (KENALOG) 0.1 % Apply 1 application topically 2 (two) times daily. 30 g 0  ? ?No current facility-administered medications on file prior to visit.  ? ? ?No Known Allergies ? ?Social History  ? ?Socioeconomic History  ? Marital status: Single  ?  Spouse name: Not on file  ? Number of children: Not on file  ? Years of education: Not on file  ? Highest education level: Not on file  ?Occupational History  ? Not on file  ?Tobacco Use  ? Smoking status: Former  ?  Packs/day: 0.50  ?  Years: 15.00  ?  Pack years: 7.50  ?  Types: Cigarettes  ?  Quit date: 07/06/2020  ?  Years since quitting: 0.8  ? Smokeless tobacco: Never  ?Substance and Sexual Activity  ? Alcohol use: Yes  ?  Comment: occasional wine 2/month  ? Drug use: No  ? Sexual activity: Yes  ?  Birth control/protection: Implant  ?  Comment: implanon  ?Other Topics Concern  ? Not on  file  ?Social History Narrative  ? Not on file  ? ?Social Determinants of Health  ? ?Financial Resource Strain: Not on file  ?Food Insecurity: Not on file  ?Transportation Needs: Not on file  ?Physical Activity: Not on file  ?Stress: Not on file  ?Social Connections: Not on file  ?Intimate Partner Violence: Not on file  ? ? ?Family History  ?Problem Relation Age of Onset  ? Hypertension Mother   ? Heart failure Father 74  ? Diabetes Maternal Grandmother   ? ? ?Past Surgical History:  ?Procedure Laterality Date  ? BACK SURGERY    ? rods in lower back  ? DILATATION & CURETTAGE/HYSTEROSCOPY WITH MYOSURE N/A 10/27/2015  ? Procedure: DILATATION & CURETTAGE/HYSTEROSCOPY WITH MYOSURE WITH RESECTION OF FIBROID;  Surgeon: Servando Salina, MD;  Location: [facility];  Service: Gynecology;  Laterality: N/A;  Requests 1 hr.  ? WISDOM TOOTH EXTRACTION    ? ? ?ROS: ?Review of Systems ?Negative except as stated above ? ?PHYSICAL EXAM: ?BP 139/85 (BP Location: Left Arm, Patient Position: Sitting, Cuff Size: Large)   Pulse 88   Temp 98.3 ?F (36.8 ?C)   Resp 18   Ht 5' 2.76" (1.594 m)   Wt 163 lb (73.9 kg)   SpO2 99%   BMI 29.10 kg/m?  ? ?Physical Exam ?HENT:  ?   Head: Normocephalic and atraumatic.  ?  Eyes:  ?   Extraocular Movements: Extraocular movements intact.  ?   Conjunctiva/sclera: Conjunctivae normal.  ?   Pupils: Pupils are equal, round, and reactive to light.  ?Cardiovascular:  ?   Rate and Rhythm: Normal rate and regular rhythm.  ?   Pulses: Normal pulses.  ?   Heart sounds: Normal heart sounds.  ?Pulmonary:  ?   Effort: Pulmonary effort is normal.  ?   Breath sounds: Normal breath sounds.  ?Musculoskeletal:  ?   Cervical back: Normal range of motion and neck supple.  ?Neurological:  ?   General: No focal deficit present.  ?   Mental Status: She is alert and oriented to person, place, and time.  ?Psychiatric:     ?   Mood and Affect: Mood normal.     ?   Behavior: Behavior normal.  ? ? ?ASSESSMENT AND PLAN: ?1.  Essential hypertension: ?- Continue Losartan as prescribed.  ?- Counseled on blood pressure goal of less than 130/80, low-sodium, DASH diet, medication compliance, and 150 minutes of moderate intensity exercise per week as tolerated. Counseled on medication adherence and adverse effects. ?- Update BMP.  ?- Follow-up with primary provider in 3 months or sooner if needed.  ?- Basic Metabolic Panel ?- losartan (COZAAR) 25 MG tablet; TAKE 1 TABLET(25 MG) BY MOUTH DAILY  Dispense: 90 tablet; Refill: 0 ? ? ? ?Patient was given the opportunity to ask questions.  Patient verbalized understanding of the plan and was able to repeat key elements of the plan. Patient was given clear instructions to go to Emergency Department or return to medical center if symptoms don't improve, worsen, or new problems develop.The patient verbalized understanding. ? ? ?Orders Placed This Encounter  ?Procedures  ? Basic Metabolic Panel  ? ? ? ?Requested Prescriptions  ? ?Signed Prescriptions Disp Refills  ? losartan (COZAAR) 25 MG tablet 90 tablet 0  ?  Sig: TAKE 1 TABLET(25 MG) BY MOUTH DAILY  ? ? ?Return in about 3 months (around 08/29/2021) for Follow-Up or next available HTN with Dorna Mai, MD . ? ?[name], NP  ?

## 2021-05-30 ENCOUNTER — Ambulatory Visit (INDEPENDENT_AMBULATORY_CARE_PROVIDER_SITE_OTHER): Payer: 59 | Admitting: Family

## 2021-05-30 ENCOUNTER — Encounter: Payer: Self-pay | Admitting: Family

## 2021-05-30 VITALS — BP 139/85 | HR 88 | Temp 98.3°F | Resp 18 | Ht 62.76 in | Wt 163.0 lb

## 2021-05-30 DIAGNOSIS — I10 Essential (primary) hypertension: Secondary | ICD-10-CM | POA: Diagnosis not present

## 2021-05-30 MED ORDER — LOSARTAN POTASSIUM 25 MG PO TABS: ORAL_TABLET | ORAL | 0 refills | Status: DC

## 2021-05-30 NOTE — Patient Instructions (Signed)
Losartan Tablets What is this medication? LOSARTAN (loe SAR tan) treats high blood pressure. It may also be used to prevent a stroke in people with heart disease and high blood pressure. It can be used to prevent kidney damage in people with diabetes. It works by relaxing the blood vessels, which helps decrease the amount of work your heart has to do. It belongs to a group of medications called ARBs. This medicine may be used for other purposes; ask your health care provider or pharmacist if you have questions. COMMON BRAND NAME(S): Cozaar What should I tell my care team before I take this medication? They need to know if you have any of these conditions: Heart failure Kidney disease Liver disease An unusual or allergic reaction to losartan, other medications, foods, dyes, or preservatives Pregnant or trying to get pregnant Breast-feeding How should I use this medication? Take this medication by mouth. Take it as directed on the prescription label at the same time every day. You can take it with or without food. If it upsets your stomach, take it with food. Keep taking it unless your care team tells you to stop. Talk to your care team about the use of this medication in children. While it may be prescribed for children as young as 6 for selected conditions, precautions do apply. Overdosage: If you think you have taken too much of this medicine contact a poison control center or emergency room at once. NOTE: This medicine is only for you. Do not share this medicine with others. What if I miss a dose? If you miss a dose, take it as soon as you can. If it is almost time for your next dose, take only that dose. Do not take double or extra doses. What may interact with this medication? Aliskiren ACE inhibitors, like enalapril or lisinopril Diuretics, especially amiloride, eplerenone, spironolactone, or triamterene Lithium NSAIDs, medications for pain and inflammation, like ibuprofen or  naproxen Potassium salts or potassium supplements This list may not describe all possible interactions. Give your health care provider a list of all the medicines, herbs, non-prescription drugs, or dietary supplements you use. Also tell them if you smoke, drink alcohol, or use illegal drugs. Some items may interact with your medicine. What should I watch for while using this medication? Visit your care team for regular check ups. Check your blood pressure as directed. Ask your care team what your blood pressure should be. Also, find out when you should contact them. Do not treat yourself for coughs, colds, or pain while you are using this medication without asking your care team for advice. Some medications may increase your blood pressure. Women should inform their care team if they wish to become pregnant or think they might be pregnant. There is a potential for serious side effects to an unborn child. Talk to your care team for more information. You may get drowsy or dizzy. Do not drive, use machinery, or do anything that needs mental alertness until you know how this medication affects you. Do not stand or sit up quickly, especially if you are an older patient. This reduces the risk of dizzy or fainting spells. Alcohol can make you more drowsy and dizzy. Avoid alcoholic drinks. Avoid salt substitutes unless you are told otherwise by your care team. What side effects may I notice from receiving this medication? Side effects that you should report to your care team as soon as possible: Allergic reactions--skin rash, itching, hives, swelling of the face, lips, tongue, or   throat High potassium level--muscle weakness, fast or irregular heartbeat Kidney injury--decrease in the amount of urine, swelling of the ankles, hands, or feet Low blood pressure--dizziness, feeling faint or lightheaded, blurry vision Side effects that usually do not require medical attention (report to your care team if they  continue or are bothersome): Dizziness Headache Runny or stuffy nose This list may not describe all possible side effects. Call your doctor for medical advice about side effects. You may report side effects to FDA at 1-800-FDA-1088. Where should I keep my medication? Keep out of the reach of children and pets. Store at room temperature between 20 and 25 degrees C (68 and 77 degrees F). Protect from light. Keep the container tightly closed. Get rid of any unused medication after the expiration date. To get rid of medications that are no longer needed or have expired: Take the medication to a medication take-back program. Check with your pharmacy or law enforcement to find a location. If you cannot return the medication, check the label or package insert to see if the medication should be thrown out in the garbage or flushed down the toilet. If you are not sure, ask your care team. If it is safe to put in the trash, empty the medication out of the container. Mix the medication with cat litter, dirt, coffee grounds, or other unwanted substance. Seal the mixture in a bag or container. Put it in the trash. NOTE: This sheet is a summary. It may not cover all possible information. If you have questions about this medicine, talk to your doctor, pharmacist, or health care provider.  2023 Elsevier/Gold Standard (2020-12-23 00:00:00)  

## 2021-05-30 NOTE — Progress Notes (Signed)
.  Pt presents for hypertension f/u   

## 2021-05-31 LAB — BASIC METABOLIC PANEL
BUN/Creatinine Ratio: 12 (ref 9–23)
BUN: 9 mg/dL (ref 6–20)
CO2: 19 mmol/L — ABNORMAL LOW (ref 20–29)
Calcium: 8.8 mg/dL (ref 8.7–10.2)
Chloride: 104 mmol/L (ref 96–106)
Creatinine, Ser: 0.73 mg/dL (ref 0.57–1.00)
Glucose: 81 mg/dL (ref 70–99)
Potassium: 3.7 mmol/L (ref 3.5–5.2)
Sodium: 139 mmol/L (ref 134–144)
eGFR: 107 mL/min/{1.73_m2} (ref >59)

## 2021-05-31 NOTE — Progress Notes (Signed)
Kidney function and electrolytes normal.

## 2021-06-01 ENCOUNTER — Ambulatory Visit: Payer: 59 | Admitting: Family Medicine

## 2021-06-30 ENCOUNTER — Other Ambulatory Visit: Payer: Self-pay | Admitting: Family Medicine

## 2021-06-30 DIAGNOSIS — I10 Essential (primary) hypertension: Secondary | ICD-10-CM

## 2021-08-21 NOTE — Progress Notes (Signed)
Patient ID: [name], female    DOB: [birth year]  MRN: [id number]  CC: Chronic Care Management   Subjective: [name] is a 41 y.o. female who presents for chronic care management.   Her concerns today include:  - Doing well on current Losartan without issues/concerns. Denies any red flag symptoms. Reports recently began smoking again.  - Left shoulder pain persisting for several months. Denies any recent injury/trauma/surgery.    Patient Active Problem List   Diagnosis Date Noted   Acute pansinusitis 11/15/2020   Hidradenitis 11/15/2020   Essential hypertension 03/03/2020   Obesity (BMI 30.0-34.9) 03/03/2020     Current Outpatient Medications on File Prior to Visit  Medication Sig Dispense Refill   acetaminophen (TYLENOL) 500 MG tablet Take 500 mg by mouth every 6 (six) hours as needed.     triamcinolone cream (KENALOG) 0.1 % Apply 1 application topically 2 (two) times daily. 30 g 0   No current facility-administered medications on file prior to visit.    No Known Allergies  Social History   Socioeconomic History   Marital status: Married    Spouse name: Not on file   Number of children: Not on file   Years of education: Not on file   Highest education level: Not on file  Occupational History   Not on file  Tobacco Use   Smoking status: Former    Packs/day: 0.50    Years: 15.00    Total pack years: 7.50    Types: Cigarettes    Quit date: 07/06/2020    Years since quitting: 1.1    Passive exposure: Past   Smokeless tobacco: Never  Substance and Sexual Activity   Alcohol use: Yes    Comment: occasional wine 2/month   Drug use: No   Sexual activity: Yes    Birth control/protection: Implant    Comment: implanon  Other Topics Concern   Not on file  Social History Narrative   Not on file   Social Determinants of Health   Financial Resource Strain: Not on file  Food Insecurity: Not on file  Transportation Needs: Not on file  Physical Activity:  Not on file  Stress: Not on file  Social Connections: Not on file  Intimate Partner Violence: Not on file    Family History  Problem Relation Age of Onset   Hypertension Mother    Heart failure Father 36   Diabetes Maternal Grandmother     Past Surgical History:  Procedure Laterality Date   BACK SURGERY     rods in lower back   DILATATION & CURETTAGE/HYSTEROSCOPY WITH MYOSURE N/A 10/27/2015   Procedure: DILATATION & CURETTAGE/HYSTEROSCOPY WITH MYOSURE WITH RESECTION OF FIBROID;  Surgeon: Servando Salina, MD;  Location: [facility];  Service: Gynecology;  Laterality: N/A;  Requests 1 hr.   WISDOM TOOTH EXTRACTION      ROS: Review of Systems Negative except as stated above  PHYSICAL EXAM: BP (!) 142/109 (BP Location: Left Arm, Patient Position: Sitting, Cuff Size: Large)   Pulse 90   Temp 98.3 F (36.8 C)   Resp 16   Ht 5' 2.76" (1.594 m)   Wt 179 lb (81.2 kg)   SpO2 98%   BMI 31.96 kg/m   Physical Exam HENT:     Head: Normocephalic and atraumatic.  Eyes:     Extraocular Movements: Extraocular movements intact.     Conjunctiva/sclera: Conjunctivae normal.     Pupils: Pupils are equal, round, and reactive to light.  Cardiovascular:     Rate and Rhythm: Normal rate and regular rhythm.     Pulses: Normal pulses.     Heart sounds: Normal heart sounds.  Pulmonary:     Effort: Pulmonary effort is normal.     Breath sounds: Normal breath sounds.  Musculoskeletal:     Left shoulder: Decreased range of motion.     Cervical back: Normal range of motion and neck supple.  Neurological:     General: No focal deficit present.     Mental Status: She is alert and oriented to person, place, and time.  Psychiatric:        Mood and Affect: Mood normal.        Behavior: Behavior normal.     ASSESSMENT AND PLAN: 1. Essential (primary) hypertension - Blood pressure not at goal during today's visit. Patient asymptomatic without chest pressure, chest pain, palpitations, shortness  of breath, worst headache of life, and any additional red flag symptoms. - Increase Losartan from 25 mg daily to 50 mg daily.  - Counseled on blood pressure goal of less than 130/80, low-sodium, DASH diet, medication compliance, and 150 minutes of moderate intensity exercise per week as tolerated. Counseled on medication adherence and adverse effects. - Follow-up with primary provider in 2 weeks or sooner if needed for blood pressure check.  - losartan (COZAAR) 50 MG tablet; Take 1 tablet (50 mg total) by mouth daily.  Dispense: 30 tablet; Refill: 2  2. Chronic left shoulder pain - Meloxicam and Duloxetine as prescribed. Counseled on medication adherence and adverse effects.  - Referral to Orthopedic Surgery for further evaluation and management.  - meloxicam (MOBIC) 7.5 MG tablet; Take 1 tablet (7.5 mg total) by mouth daily.  Dispense: 30 tablet; Refill: 1 - DULoxetine (CYMBALTA) 20 MG capsule; Take 1 capsule (20 mg total) by mouth daily.  Dispense: 30 capsule; Refill: 1 - Ambulatory referral to Orthopedic Surgery    Patient was given the opportunity to ask questions.  Patient verbalized understanding of the plan and was able to repeat key elements of the plan. Patient was given clear instructions to go to Emergency Department or return to medical center if symptoms don't improve, worsen, or new problems develop.The patient verbalized understanding.   Orders Placed This Encounter  Procedures   Ambulatory referral to Orthopedic Surgery     Requested Prescriptions   Signed Prescriptions Disp Refills   meloxicam (MOBIC) 7.5 MG tablet 30 tablet 1    Sig: Take 1 tablet (7.5 mg total) by mouth daily.   DULoxetine (CYMBALTA) 20 MG capsule 30 capsule 1    Sig: Take 1 capsule (20 mg total) by mouth daily.   losartan (COZAAR) 50 MG tablet 30 tablet 2    Sig: Take 1 tablet (50 mg total) by mouth daily.    Return in about 2 weeks (around 09/13/2021) for Follow-Up or next available blood  pressure check.  Evelyn Herter, NP

## 2021-08-30 ENCOUNTER — Encounter: Payer: Self-pay | Admitting: Family

## 2021-08-30 ENCOUNTER — Ambulatory Visit (INDEPENDENT_AMBULATORY_CARE_PROVIDER_SITE_OTHER): Payer: 59 | Admitting: Family

## 2021-08-30 VITALS — BP 142/109 | HR 90 | Temp 98.3°F | Resp 16 | Ht 62.76 in | Wt 179.0 lb

## 2021-08-30 DIAGNOSIS — G8929 Other chronic pain: Secondary | ICD-10-CM

## 2021-08-30 DIAGNOSIS — M25512 Pain in left shoulder: Secondary | ICD-10-CM | POA: Diagnosis not present

## 2021-08-30 DIAGNOSIS — I10 Essential (primary) hypertension: Secondary | ICD-10-CM | POA: Diagnosis not present

## 2021-08-30 MED ORDER — MELOXICAM 7.5 MG PO TABS: 7.5000 mg | ORAL_TABLET | Freq: Every day | ORAL | 1 refills | Status: DC

## 2021-08-30 MED ORDER — DULOXETINE HCL 20 MG PO CPEP: 20.0000 mg | ORAL_CAPSULE | Freq: Every day | ORAL | 1 refills | Status: DC

## 2021-08-30 MED ORDER — LOSARTAN POTASSIUM 50 MG PO TABS: 50.0000 mg | ORAL_TABLET | Freq: Every day | ORAL | 2 refills | Status: DC

## 2021-08-30 NOTE — Progress Notes (Signed)
Pt presents for hypertension f/u, left shoulder pain that started few months ago

## 2021-09-04 NOTE — Progress Notes (Signed)
Patient ID: [name], female    DOB: 1980/09/07  MRN: [id number]  CC: Blood Pressure Check   Subjective: [name] is a 41 y.o. female who presents for blood pressure check.   Her concerns today include:  - Doing well on Losartan without issues or concerns. - Chantix causing nightmares. Would like to try alternate medication.   Patient Active Problem List   Diagnosis Date Noted   Acute pansinusitis 11/15/2020   Hidradenitis 11/15/2020   Essential hypertension 03/03/2020   Obesity (BMI 30.0-34.9) 03/03/2020     Current Outpatient Medications on File Prior to Visit  Medication Sig Dispense Refill   acetaminophen (TYLENOL) 500 MG tablet Take 500 mg by mouth every 6 (six) hours as needed.     DULoxetine (CYMBALTA) 20 MG capsule Take 1 capsule (20 mg total) by mouth daily. 30 capsule 1   losartan (COZAAR) 50 MG tablet Take 1 tablet (50 mg total) by mouth daily. 30 tablet 2   meloxicam (MOBIC) 7.5 MG tablet Take 1 tablet (7.5 mg total) by mouth daily. 30 tablet 1   triamcinolone cream (KENALOG) 0.1 % Apply 1 application topically 2 (two) times daily. 30 g 0   No current facility-administered medications on file prior to visit.    No Known Allergies  Social History   Socioeconomic History   Marital status: Married    Spouse name: Not on file   Number of children: Not on file   Years of education: Not on file   Highest education level: Not on file  Occupational History   Not on file  Tobacco Use   Smoking status: Former    Packs/day: 0.50    Years: 15.00    Total pack years: 7.50    Types: Cigarettes    Quit date: 07/06/2020    Years since quitting: 1.1    Passive exposure: Past   Smokeless tobacco: Never  Substance and Sexual Activity   Alcohol use: Yes    Comment: occasional wine 2/month   Drug use: No   Sexual activity: Yes    Birth control/protection: Implant    Comment: implanon  Other Topics Concern   Not on file  Social History Narrative   Not  on file   Social Determinants of Health   Financial Resource Strain: Not on file  Food Insecurity: Not on file  Transportation Needs: Not on file  Physical Activity: Not on file  Stress: Not on file  Social Connections: Not on file  Intimate Partner Violence: Not on file    Family History  Problem Relation Age of Onset   Hypertension Mother    Heart failure Father 8   Diabetes Maternal Grandmother     Past Surgical History:  Procedure Laterality Date   BACK SURGERY     rods in lower back   DILATATION & CURETTAGE/HYSTEROSCOPY WITH MYOSURE N/A 10/27/2015   Procedure: DILATATION & CURETTAGE/HYSTEROSCOPY WITH MYOSURE WITH RESECTION OF FIBROID;  Surgeon: Servando Salina, MD;  Location: [facility];  Service: Gynecology;  Laterality: N/A;  Requests 1 hr.   WISDOM TOOTH EXTRACTION      ROS: Review of Systems Negative except as stated above  PHYSICAL EXAM: BP 134/88 (BP Location: Left Arm, Patient Position: Sitting, Cuff Size: Large)   Pulse 84   Temp 98.3 F (36.8 C)   Resp 16   Ht 5' 2.76" (1.594 m)   Wt 176 lb (79.8 kg)   SpO2 98%   BMI 31.42 kg/m  Physical Exam HENT:     Head: Normocephalic and atraumatic.  Eyes:     Extraocular Movements: Extraocular movements intact.     Conjunctiva/sclera: Conjunctivae normal.     Pupils: Pupils are equal, round, and reactive to light.  Cardiovascular:     Rate and Rhythm: Normal rate and regular rhythm.     Pulses: Normal pulses.     Heart sounds: Normal heart sounds.  Pulmonary:     Effort: Pulmonary effort is normal.     Breath sounds: Normal breath sounds.  Musculoskeletal:     Cervical back: Normal range of motion and neck supple.  Neurological:     General: No focal deficit present.     Mental Status: She is alert and oriented to person, place, and time.  Psychiatric:        Mood and Affect: Mood normal.        Behavior: Behavior normal.     ASSESSMENT AND PLAN: 1. Essential (primary) hypertension - Continue  Losartan as prescribed. No refills needed as of present.  - Counseled on blood pressure goal of less than 130/80, low-sodium, DASH diet, medication compliance, and 150 minutes of moderate intensity exercise per week as tolerated. Counseled on medication adherence and adverse effects. - Update BMP.  - Follow-up with primary provider in 4 months or sooner if needed.  - Basic Metabolic Panel  2. Encounter for smoking cessation counseling - Trial Bupropion as prescribed. Counseled on medication adherence and adverse effects. - Follow-up with primary provider in 4 weeks or sooner if needed.  - buPROPion (ZYBAN) 150 MG 12 hr tablet; Take 1 tablet (150 mg total) by mouth daily for 3 days, THEN 1 tablet (150 mg total) in the morning and at bedtime for 27 days.  Dispense: 57 tablet; Refill: 0   Patient was given the opportunity to ask questions.  Patient verbalized understanding of the plan and was able to repeat key elements of the plan. Patient was given clear instructions to go to Emergency Department or return to medical center if symptoms don't improve, worsen, or new problems develop.The patient verbalized understanding.   Orders Placed This Encounter  Procedures   Basic Metabolic Panel     Requested Prescriptions   Signed Prescriptions Disp Refills   buPROPion (ZYBAN) 150 MG 12 hr tablet 57 tablet 0    Sig: Take 1 tablet (150 mg total) by mouth daily for 3 days, THEN 1 tablet (150 mg total) in the morning and at bedtime for 27 days.    Return in about 4 months (around 01/15/2022) for Follow-Up or next available chronic care mgmt and 4 weeks smoking cessation.  [name], NP

## 2021-09-05 ENCOUNTER — Ambulatory Visit (INDEPENDENT_AMBULATORY_CARE_PROVIDER_SITE_OTHER): Payer: 59

## 2021-09-05 ENCOUNTER — Ambulatory Visit (INDEPENDENT_AMBULATORY_CARE_PROVIDER_SITE_OTHER): Payer: 59 | Admitting: Orthopaedic Surgery

## 2021-09-05 DIAGNOSIS — M25512 Pain in left shoulder: Secondary | ICD-10-CM

## 2021-09-05 NOTE — Progress Notes (Signed)
Office Visit Note   Patient: [name]           Date of Birth: 26-Oct-1980           MRN: [id number] Visit Date: 09/05/2021              Requested by: [name], NP [address],  [place] [id number] PCP: [name], NP   Assessment & Plan: Visit Diagnoses:  1. Left shoulder pain, unspecified chronicity     Plan: Impression is left shoulder pain etiology bursitis versus rotator cuff tendinitis.  Low suspicion for structural abnormalities.  No evidence of frozen shoulder.  Treatment options reviewed and we will start with 2 weeks of meloxicam and rest.  If not better she can come in for subacromial injection.  Follow-Up Instructions: No follow-ups on file.   Orders:  Orders Placed This Encounter  Procedures   XR Shoulder Left   No orders of the defined types were placed in this encounter.     Procedures: No procedures performed   Clinical Data: No additional findings.   Subjective: Chief Complaint  Patient presents with   Left Shoulder - Pain    HPI [name] is a 41 year old female here for left shoulder pain for few months.  No injuries or surgeries.  No radiculopathy.  Feels a pulling sensation at the top of the shoulder.  Popping at times. Review of Systems  Constitutional: Negative.   HENT: Negative.    Eyes: Negative.   Respiratory: Negative.    Cardiovascular: Negative.   Endocrine: Negative.   Musculoskeletal: Negative.   Neurological: Negative.   Hematological: Negative.   Psychiatric/Behavioral: Negative.    All other systems reviewed and are negative.    Objective: Vital Signs: There were no vitals taken for this visit.  Physical Exam Vitals and nursing note reviewed.  Constitutional:      Appearance: She is well-developed.  HENT:     Head: Atraumatic.     Nose: Nose normal.  Eyes:     Extraocular Movements: Extraocular movements intact.  Cardiovascular:     Pulses: Normal pulses.  Pulmonary:      Effort: Pulmonary effort is normal.  Abdominal:     Palpations: Abdomen is soft.  Musculoskeletal:     Cervical back: Neck supple.  Skin:    General: Skin is warm.     Capillary Refill: Capillary refill takes less than 2 seconds.  Neurological:     Mental Status: She is alert. Mental status is at baseline.  Psychiatric:        Behavior: Behavior normal.        Thought Content: Thought content normal.        Judgment: Judgment normal.     Ortho Exam Examination of the left shoulder girdle shows no masses lesions or swelling.  Normal range of motion.  Manual muscle testing of rotator cuff shows good strength with minimal pain.  No real impingement signs.  AC joint is nontender. Specialty Comments:  No specialty comments available.  Imaging: XR Shoulder Left  Result Date: 09/05/2021 No acute or structural abnormalities.  Hardware present from previous scoliosis surgery    PMFS History: Patient Active Problem List   Diagnosis Date Noted   Acute pansinusitis 11/15/2020   Hidradenitis 11/15/2020   Essential hypertension 03/03/2020   Obesity (BMI 30.0-34.9) 03/03/2020   Past Medical History:  Diagnosis Date   Essential hypertension 03/03/2020   Obesity (BMI 30.0-34.9) 03/03/2020  Scoliosis    SVD (spontaneous vaginal delivery)    x 2    Family History  Problem Relation Age of Onset   Hypertension Mother    Heart failure Father 55   Diabetes Maternal Grandmother     Past Surgical History:  Procedure Laterality Date   BACK SURGERY     rods in lower back   DILATATION & CURETTAGE/HYSTEROSCOPY WITH MYOSURE N/A 10/27/2015   Procedure: DILATATION & CURETTAGE/HYSTEROSCOPY WITH MYOSURE WITH RESECTION OF FIBROID;  Surgeon: Servando Salina, MD;  Location: [facility];  Service: Gynecology;  Laterality: N/A;  Requests 1 hr.   WISDOM TOOTH EXTRACTION     Social History   Occupational History   Not on file  Tobacco Use   Smoking status: Former    Packs/day: 0.50    Years: 15.00     Total pack years: 7.50    Types: Cigarettes    Quit date: 07/06/2020    Years since quitting: 1.1    Passive exposure: Past   Smokeless tobacco: Never  Substance and Sexual Activity   Alcohol use: Yes    Comment: occasional wine 2/month   Drug use: No   Sexual activity: Yes    Birth control/protection: Implant    Comment: implanon

## 2021-09-11 ENCOUNTER — Other Ambulatory Visit: Payer: Self-pay | Admitting: Family Medicine

## 2021-09-11 DIAGNOSIS — I10 Essential (primary) hypertension: Secondary | ICD-10-CM

## 2021-09-15 ENCOUNTER — Ambulatory Visit (INDEPENDENT_AMBULATORY_CARE_PROVIDER_SITE_OTHER): Payer: 59 | Admitting: Family

## 2021-09-15 VITALS — BP 134/88 | HR 84 | Temp 98.3°F | Resp 16 | Ht 62.76 in | Wt 176.0 lb

## 2021-09-15 DIAGNOSIS — Z716 Tobacco abuse counseling: Secondary | ICD-10-CM | POA: Diagnosis not present

## 2021-09-15 DIAGNOSIS — I10 Essential (primary) hypertension: Secondary | ICD-10-CM

## 2021-09-15 MED ORDER — BUPROPION HCL ER (SMOKING DET) 150 MG PO TB12: ORAL_TABLET | ORAL | 0 refills | Status: AC

## 2021-09-15 NOTE — Progress Notes (Signed)
.  Pt presents for hypertension f/u   

## 2021-09-15 NOTE — Patient Instructions (Signed)
Bupropion Sustained-Release Tablets (Smoking Cessation) What is this medication? BUPROPION (byoo PROE pee on) helps you quit smoking. It reduces cravings for nicotine, the addictive substance found in tobacco. It may also help reduce symptoms of withdrawal. It is most effective when used in combination with a stop-smoking program. This medicine may be used for other purposes; ask your health care provider or pharmacist if you have questions. COMMON BRAND NAME(S): Buproban, Zyban What should I tell my care team before I take this medication? They need to know if you have any of these conditions: An eating disorder, such as anorexia or bulimia Bipolar disorder or psychosis Diabetes or high blood sugar, treated with medication Glaucoma Head injury or brain tumor Heart disease, previous heart attack, or irregular heart beat High blood pressure Kidney or liver disease Seizures Suicidal thoughts or a previous suicide attempt Tourette's syndrome Weight loss An unusual or allergic reaction to bupropion, other medications, foods, dyes, or preservatives Pregnant or trying to become pregnant Breast-feeding How should I use this medication? Take this medication by mouth with a glass of water. Follow the directions on the prescription label. You can take it with or without food. If it upsets your stomach, take it with food. Do not cut, crush or chew this medication. Take your medication at regular intervals. If you take this medication more than once a day, take your second dose at least 8 hours after you take your first dose. To limit trouble in sleeping, avoid taking this medication at bedtime. Do not take your medication more often than directed. Do not stop taking this medication suddenly except upon the advice of your care team. Stopping this medication too quickly may cause serious side effects. A special MedGuide will be given to you by the pharmacist with each prescription and refill. Be sure to  read this information carefully each time. Talk to your care team about the use of this medication in children. Special care may be needed. Overdosage: If you think you have taken too much of this medicine contact a poison control center or emergency room at once. NOTE: This medicine is only for you. Do not share this medicine with others. What if I miss a dose? If you miss a dose, skip the missed dose and take your next tablet at the regular time. There should be at least 8 hours between doses. Do not take double or extra doses. What may interact with this medication? Do not take this medication with any of the following: Linezolid MAOIs like Azilect, Carbex, Eldepryl, Marplan, Nardil, and Parnate Methylene blue (injected into a vein) Other medications that contain bupropion like Wellbutrin This medication may also interact with the following: Alcohol Certain medications for anxiety or sleep Certain medications for blood pressure like metoprolol, propranolol Certain medications for depression or psychotic disturbances Certain medications for HIV or AIDS like efavirenz, lopinavir, nelfinavir, ritonavir Certain medications for irregular heart beat like propafenone, flecainide Certain medications for Parkinson's disease like amantadine, levodopa Certain medications for seizures like carbamazepine, phenytoin, phenobarbital Cimetidine Clopidogrel Cyclophosphamide Digoxin Furazolidone Isoniazid Nicotine Orphenadrine Procarbazine Steroid medications like prednisone or cortisone Stimulant medications for attention disorders, weight loss, or to stay awake Tamoxifen Theophylline Thiotepa Ticlopidine Tramadol Warfarin This list may not describe all possible interactions. Give your health care provider a list of all the medicines, herbs, non-prescription drugs, or dietary supplements you use. Also tell them if you smoke, drink alcohol, or use illegal drugs. Some items may interact with  your medicine. What should I   watch for while using this medication? Visit your care team for regular checks on your progress. This medication should be used together with a patient support program. It is important to participate in a behavioral program, counseling, or other support program that is recommended by your care team. Watch for new or worsening thoughts of suicide or depression. This includes sudden changes in mood, behavior, or thoughts. These changes can happen at any time but are more common in the beginning of treatment or after a change in dose. Call your care team right away if you experience these thoughts or worsening depression. Manic episodes may happen in patients with bipolar disorder who take this medication. Watch for changes in feelings or behaviors such as feeling anxious, nervous, agitated, panicky, irritable, hostile, aggressive, impulsive, severely restless, overly excited and hyperactive, or trouble sleeping. These symptoms can happen at anytime but are more common in the beginning of treatment or after a change in dose. Call your care team right away if you notice any of these symptoms. This medication may cause serious skin reactions. They can happen weeks to months after starting the medication. Contact your care team right away if you notice fevers or flu-like symptoms with a rash. The rash may be red or purple and then turn into blisters or peeling of the skin. Or, you might notice a red rash with swelling of the face, lips or lymph nodes in your neck or under your arms. Avoid drinks that contain alcohol while taking this medication. Drinking large amounts of alcohol, using sleeping or anxiety medications, or quickly stopping the use of these agents while taking this medication may increase your risk for a seizure. Do not drive or use heavy machinery until you know how this medication affects you. This medication can impair your ability to perform these tasks. Do not take  this medication close to bedtime. It may prevent you from sleeping. Your mouth may get dry. Chewing sugarless gum or sucking hard candy, and drinking plenty of water may help. Contact your care team if the problem does not go away or is severe. Do not use nicotine patches or chewing gum without the advice of your care team while taking this medication. You may need to have your blood pressure taken regularly if your doctor recommends that you use both nicotine and this medication together. What side effects may I notice from receiving this medication? Side effects that you should report to your care team as soon as possible: Allergic reactions--skin rash, itching, hives, swelling of the face, lips, tongue, or throat Increase in blood pressure Mood and behavior changes--anxiety, nervousness, confusion, hallucinations, irritability, hostility, thoughts of suicide or self-harm, worsening mood, feelings of depression Redness, blistering, peeling, or loosening of the skin, including inside the mouth Seizures Sudden eye pain or change in vision such as blurry vision, seeing halos around lights, vision loss Side effects that usually do not require medical attention (report to your care team if they continue or are bothersome): Constipation Dizziness Dry mouth Loss of appetite Nausea Tremors or shaking Trouble sleeping This list may not describe all possible side effects. Call your doctor for medical advice about side effects. You may report side effects to FDA at 1-800-FDA-1088. Where should I keep my medication? Keep out of the reach of children and pets. Store at room temperature between 20 and 25 degrees C (68 and 77 degrees F). Protect from light. Keep container tightly closed. Throw away any unused medication after the expiration date. NOTE:   This sheet is a summary. It may not cover all possible information. If you have questions about this medicine, talk to your doctor, pharmacist, or health  care provider.  2023 Elsevier/Gold Standard (2020-01-25 00:00:00)  

## 2021-09-16 LAB — BASIC METABOLIC PANEL
BUN/Creatinine Ratio: 10 (ref 9–23)
BUN: 7 mg/dL (ref 6–24)
CO2: 21 mmol/L (ref 20–29)
Calcium: 9.4 mg/dL (ref 8.7–10.2)
Chloride: 105 mmol/L (ref 96–106)
Creatinine, Ser: 0.7 mg/dL (ref 0.57–1.00)
Glucose: 85 mg/dL (ref 70–99)
Potassium: 3.7 mmol/L (ref 3.5–5.2)
Sodium: 143 mmol/L (ref 134–144)
eGFR: 112 mL/min/{1.73_m2} (ref >59)

## 2021-10-12 ENCOUNTER — Other Ambulatory Visit: Payer: Self-pay

## 2021-10-12 ENCOUNTER — Emergency Department (HOSPITAL_COMMUNITY): Payer: 59

## 2021-10-12 ENCOUNTER — Emergency Department (HOSPITAL_COMMUNITY)
Admission: EM | Admit: 2021-10-12 | Discharge: 2021-10-12 | Disposition: A | Discharge status: Home / Self Care | Payer: 59 | Attending: Emergency Medicine | Admitting: Emergency Medicine

## 2021-10-12 ENCOUNTER — Encounter (HOSPITAL_COMMUNITY): Payer: Self-pay | Admitting: Emergency Medicine

## 2021-10-12 DIAGNOSIS — Z20822 Contact with and (suspected) exposure to covid-19: Secondary | ICD-10-CM | POA: Insufficient documentation

## 2021-10-12 DIAGNOSIS — E876 Hypokalemia: Secondary | ICD-10-CM | POA: Insufficient documentation

## 2021-10-12 DIAGNOSIS — R197 Diarrhea, unspecified: Secondary | ICD-10-CM | POA: Diagnosis not present

## 2021-10-12 DIAGNOSIS — I10 Essential (primary) hypertension: Secondary | ICD-10-CM | POA: Insufficient documentation

## 2021-10-12 DIAGNOSIS — E860 Dehydration: Secondary | ICD-10-CM | POA: Insufficient documentation

## 2021-10-12 DIAGNOSIS — R112 Nausea with vomiting, unspecified: Secondary | ICD-10-CM

## 2021-10-12 LAB — CBC WITH DIFFERENTIAL/PLATELET
Abs Immature Granulocytes: 0.06 10*3/uL (ref 0.00–0.07)
Basophils Absolute: 0 10*3/uL (ref 0.0–0.1)
Basophils Relative: 0 %
Eosinophils Absolute: 0.1 10*3/uL (ref 0.0–0.5)
Eosinophils Relative: 1 %
HCT: 51.7 % — ABNORMAL HIGH (ref 36.0–46.0)
Hemoglobin: 17.1 g/dL — ABNORMAL HIGH (ref 12.0–15.0)
Immature Granulocytes: 1 %
Lymphocytes Relative: 41 %
Lymphs Abs: 3.5 10*3/uL (ref 0.7–4.0)
MCH: 27.9 pg (ref 26.0–34.0)
MCHC: 33.1 g/dL (ref 30.0–36.0)
MCV: 84.3 fL (ref 80.0–100.0)
Monocytes Absolute: 0.4 10*3/uL (ref 0.1–1.0)
Monocytes Relative: 5 %
Neutro Abs: 4.6 10*3/uL (ref 1.7–7.7)
Neutrophils Relative %: 52 %
Platelets: 394 10*3/uL (ref 150–400)
RBC: 6.13 MIL/uL — ABNORMAL HIGH (ref 3.87–5.11)
RDW: 13.2 % (ref 11.5–15.5)
WBC: 8.7 10*3/uL (ref 4.0–10.5)
nRBC: 0 % (ref 0.0–0.2)

## 2021-10-12 LAB — URINALYSIS, ROUTINE W REFLEX MICROSCOPIC
Bilirubin Urine: NEGATIVE
Glucose, UA: NEGATIVE mg/dL
Ketones, ur: 5 mg/dL — AB
Leukocytes,Ua: NEGATIVE
Nitrite: NEGATIVE
Protein, ur: 30 mg/dL — AB
Specific Gravity, Urine: >1.046 — ABNORMAL HIGH (ref 1.005–1.030)
pH: 6 (ref 5.0–8.0)

## 2021-10-12 LAB — SARS CORONAVIRUS 2 BY RT PCR: SARS Coronavirus 2 by RT PCR: NEGATIVE

## 2021-10-12 LAB — BASIC METABOLIC PANEL
Anion gap: 3 — ABNORMAL LOW (ref 5–15)
BUN: 6 mg/dL (ref 6–20)
CO2: 21 mmol/L — ABNORMAL LOW (ref 22–32)
Calcium: 7.5 mg/dL — ABNORMAL LOW (ref 8.9–10.3)
Chloride: 115 mmol/L — ABNORMAL HIGH (ref 98–111)
Creatinine, Ser: 1.06 mg/dL — ABNORMAL HIGH (ref 0.44–1.00)
GFR, Estimated: >60 mL/min (ref >60)
Glucose, Bld: 128 mg/dL — ABNORMAL HIGH (ref 70–99)
Potassium: 3.8 mmol/L (ref 3.5–5.1)
Sodium: 139 mmol/L (ref 135–145)

## 2021-10-12 LAB — COMPREHENSIVE METABOLIC PANEL
ALT: 11 U/L (ref 0–44)
AST: 17 U/L (ref 15–41)
Albumin: 4.4 g/dL (ref 3.5–5.0)
Alkaline Phosphatase: 57 U/L (ref 38–126)
Anion gap: 14 (ref 5–15)
BUN: 5 mg/dL — ABNORMAL LOW (ref 6–20)
CO2: 17 mmol/L — ABNORMAL LOW (ref 22–32)
Calcium: 9.9 mg/dL (ref 8.9–10.3)
Chloride: 107 mmol/L (ref 98–111)
Creatinine, Ser: 1.41 mg/dL — ABNORMAL HIGH (ref 0.44–1.00)
GFR, Estimated: 48 mL/min — ABNORMAL LOW (ref >60)
Glucose, Bld: 220 mg/dL — ABNORMAL HIGH (ref 70–99)
Potassium: 3 mmol/L — ABNORMAL LOW (ref 3.5–5.1)
Sodium: 138 mmol/L (ref 135–145)
Total Bilirubin: 0.4 mg/dL (ref 0.3–1.2)
Total Protein: 7.6 g/dL (ref 6.5–8.1)

## 2021-10-12 LAB — LIPASE, BLOOD: Lipase: 43 U/L (ref 11–51)

## 2021-10-12 LAB — I-STAT BETA HCG BLOOD, ED (MC, WL, AP ONLY): I-stat hCG, quantitative: <5 m[IU]/mL (ref <5)

## 2021-10-12 MED ORDER — ONDANSETRON 4 MG PO TBDP: 4.0000 mg | ORAL_TABLET | Freq: Three times a day (TID) | ORAL | 0 refills | Status: DC | PRN

## 2021-10-12 MED ORDER — SODIUM CHLORIDE 0.9 % IV BOLUS
1000.0000 mL | Freq: Once | INTRAVENOUS | Status: AC
  Administered 2021-10-12: 1000 mL via INTRAVENOUS

## 2021-10-12 MED ORDER — IOHEXOL 350 MG/ML SOLN
75.0000 mL | Freq: Once | INTRAVENOUS | Status: AC | PRN
  Administered 2021-10-12: 75 mL via INTRAVENOUS

## 2021-10-12 MED ORDER — POTASSIUM CHLORIDE CRYS ER 20 MEQ PO TBCR
40.0000 meq | EXTENDED_RELEASE_TABLET | Freq: Once | ORAL | Status: AC
  Administered 2021-10-12: 40 meq via ORAL
  Filled 2021-10-12: qty 2

## 2021-10-12 MED ORDER — ONDANSETRON HCL 4 MG/2ML IJ SOLN
4.0000 mg | Freq: Once | INTRAMUSCULAR | Status: AC
  Administered 2021-10-12: 4 mg via INTRAVENOUS
  Filled 2021-10-12: qty 2

## 2021-10-12 NOTE — ED Provider Notes (Signed)
[facility] Provider Note   CSN: [id number] Arrival date & time: 10/12/21  0537     History  Chief Complaint  Patient presents with   Abdominal Pain    [name] is a 41 y.o. female.  41 year old female with history of scoliosis (corrected surgically), hypertension, presents with complaint of severe diarrhea with nausea, vomiting and abdominal discomfort. No prior abdominal surgeries, denies fevers, chills. Stools described as watery, non bloody; emesis non bloody. Numerous episodes of loose stools, 2 episodes of emesis. Abdominal pain improved. No known sick contacts. Ate at [employer] (shrimp rolls, fried okra), meat/cheese pin rolls from grocery store for dinner.        Home Medications Prior to Admission medications   Medication Sig Start Date End Date Taking? Authorizing Provider  acetaminophen (TYLENOL) 500 MG tablet Take 1,000 mg by mouth daily as needed for headache.   Yes [provider]  buPROPion (ZYBAN) 150 MG 12 hr tablet Take 1 tablet (150 mg total) by mouth daily for 3 days, THEN 1 tablet (150 mg total) in the morning and at bedtime for 27 days. Patient taking differently: Take 150 mg by mouth twice daily.  09/15/21 10/15/21 Yes Minette Brine, Amy J, NP  losartan (COZAAR) 50 MG tablet Take 1 tablet (50 mg total) by mouth daily. 08/30/21 11/28/21 Yes Minette Brine, Amy J, NP  ondansetron (ZOFRAN-ODT) 4 MG disintegrating tablet Take 1 tablet (4 mg total) by mouth every 8 (eight) hours as needed for nausea or vomiting. 10/12/21  Yes Tacy Learn, PA-C  DULoxetine (CYMBALTA) 20 MG capsule Take 1 capsule (20 mg total) by mouth daily. Patient not taking: Reported on 10/12/2021 08/30/21   [name], NP  meloxicam (MOBIC) 7.5 MG tablet Take 1 tablet (7.5 mg total) by mouth daily. Patient not taking: Reported on 10/12/2021 08/30/21   [name], NP      Allergies    Patient has no known allergies.    Review of Systems    Review of Systems Negative except as per HPI Physical Exam Updated Vital Signs BP (!) 128/101   Pulse 80   Temp 98 F (36.7 C) (Oral)   Resp 16   Ht '5\' 2"'$  (1.575 m)   Wt 79.8 kg   SpO2 100%   BMI 32.18 kg/m  Physical Exam Vitals and nursing note reviewed.  Constitutional:      General: She is not in acute distress.    Appearance: She is well-developed. She is not diaphoretic.  HENT:     Head: Normocephalic and atraumatic.  Cardiovascular:     Rate and Rhythm: Normal rate and regular rhythm.     Heart sounds: Normal heart sounds.  Pulmonary:     Effort: Pulmonary effort is normal.     Breath sounds: Normal breath sounds.  Abdominal:     Palpations: Abdomen is soft.     Tenderness: There is no abdominal tenderness.  Skin:    General: Skin is warm and dry.     Findings: No erythema or rash.  Neurological:     Mental Status: She is alert and oriented to person, place, and time.  Psychiatric:        Behavior: Behavior normal.     ED Results / Procedures / Treatments   Labs (all labs ordered are listed, but only abnormal results are displayed) Labs Reviewed  CBC WITH DIFFERENTIAL/PLATELET - Abnormal; Notable for the following components:  Result Value   RBC 6.13 (*)    Hemoglobin 17.1 (*)    HCT 51.7 (*)    All other components within normal limits  COMPREHENSIVE METABOLIC PANEL - Abnormal; Notable for the following components:   Potassium 3.0 (*)    CO2 17 (*)    Glucose, Bld 220 (*)    BUN 5 (*)    Creatinine, Ser 1.41 (*)    GFR, Estimated 48 (*)    All other components within normal limits  URINALYSIS, ROUTINE W REFLEX MICROSCOPIC - Abnormal; Notable for the following components:   APPearance HAZY (*)    Specific Gravity, Urine >1.046 (*)    Hgb urine dipstick MODERATE (*)    Ketones, ur 5 (*)    Protein, ur 30 (*)    Bacteria, UA MANY (*)    All other components within normal limits  BASIC METABOLIC PANEL - Abnormal; Notable for the following  components:   Chloride 115 (*)    CO2 21 (*)    Glucose, Bld 128 (*)    Creatinine, Ser 1.06 (*)    Calcium 7.5 (*)    Anion gap 3 (*)    All other components within normal limits  SARS CORONAVIRUS 2 BY RT PCR  LIPASE, BLOOD  I-STAT BETA HCG BLOOD, ED ([facility], [facility], [facility] ONLY)    EKG None  Radiology CT ABDOMEN PELVIS W CONTRAST  Result Date: 10/12/2021 CLINICAL DATA:  Abdominal pain, acute, nonlocalized EXAM: CT ABDOMEN AND PELVIS WITH CONTRAST TECHNIQUE: Multidetector CT imaging of the abdomen and pelvis was performed using the standard protocol following bolus administration of intravenous contrast. RADIATION DOSE REDUCTION: This exam was performed according to the departmental dose-optimization program which includes automated exposure control, adjustment of the mA and/or kV according to patient size and/or use of iterative reconstruction technique. CONTRAST:  35m OMNIPAQUE IOHEXOL 350 MG/ML SOLN COMPARISON:  None Available. FINDINGS: Lower chest: No acute abnormality. Hepatobiliary: No focal liver abnormality is seen. The gallbladder is unremarkable. Pancreas: Unremarkable. No pancreatic ductal dilatation or surrounding inflammatory changes. Spleen: Normal in size without focal abnormality. Adrenals/Urinary Tract: Adrenal glands are unremarkable. There is a punctate nonobstructive right lower pole renal stone. No hydronephrosis or nephroureterolithiasis. The bladder is decompressed. Stomach/Bowel: The stomach is within normal limits. There is no evidence of bowel obstruction. There is mild proximal small bowel wall thickening and mesenteric edema.The appendix is normal. Fluid-filled colon. Scattered colonic diverticula. Vascular/Lymphatic: No significant vascular findings are present. No enlarged abdominal or pelvic lymph nodes. Reproductive: Heterogeneous uterine fundus with multiple masses, along the endometrial/myometrial zone and with heterogeneous soft tissue density along the serosal surface of  the posterior fundus, no recent priors for comparison. Corpus luteal cyst in the right ovary is noted. Other: Small volume free fluid in the pelvis. Musculoskeletal: Partially visualized the thoracic fusion hardware. There is no acute osseous abnormality. IMPRESSION: Mild proximal small bowel wall thickening with mesenteric edema and fluid-filled colon suggesting small-bowel enteritis with diarrheal illness. Heterogeneous uterine fundus with mass-like soft tissue in the endometrial/myometrial zone and along the posterior serosal surface. While findings most likely reflect multiple leiomyomas and/or possible endometrial polyp, malignancy cannot be excluded by CT. Small volume, nonspecific free fluid in the pelvis. Correlate with any history of abnormal uterine bleeding. Recommend non-emergent but short-term follow-up pelvic ultrasound and follow-up with OBGYN. Punctate nonobstructive right lower pole renal stone. Normal appendix. Electronically Signed   By: JMaurine SimmeringM.D.   On: 10/12/2021 08:46    Procedures .Critical  Care  Performed by: Tacy Learn, PA-C Authorized by: Tacy Learn, PA-C   Critical care provider statement:    Critical care time (minutes):  30   Critical care was time spent personally by me on the following activities:  Development of treatment plan with patient or surrogate, discussions with consultants, evaluation of patient's response to treatment, examination of patient, ordering and review of laboratory studies, ordering and review of radiographic studies, ordering and performing treatments and interventions, pulse oximetry, re-evaluation of patient's condition and review of old charts     Medications Ordered in ED Medications  ondansetron (ZOFRAN) injection 4 mg (4 mg Intravenous Given 10/12/21 0601)  iohexol (OMNIPAQUE) 350 MG/ML injection 75 mL (75 mLs Intravenous Contrast Given 10/12/21 0820)  sodium chloride 0.9 % bolus 1,000 mL (0 mLs Intravenous Stopped 10/12/21 1107)   sodium chloride 0.9 % bolus 1,000 mL (0 mLs Intravenous Stopped 10/12/21 1234)  potassium chloride SA (KLOR-CON M) CR tablet 40 mEq (40 mEq Oral Given 10/12/21 0948)    ED Course/ Medical Decision Making/ A&P                           Medical Decision Making Amount and/or Complexity of Data Reviewed Labs: ordered.  Risk Prescription drug management.   This patient presents to the ED for concern of vomiting, diarrhea, abdominal pain, this involves an extensive number of treatment options, and is a complaint that carries with it a high risk of complications and morbidity.  The differential diagnosis includes but not limited to gastroenteritis, diverticulitis, colitis, appendicitis, urinary tract infection, dehydration, metabolic disturbance   Co morbidities that complicate the patient evaluation  Hypertension, scoliosis   Additional history obtained:  External records from outside source obtained and reviewed including has not PCP on 09/15/2021 for hypertension follow-up, noting to do well on losartan   Lab Tests:  I Ordered, and personally interpreted labs.  The pertinent results include: CBC with mildly elevated hemoglobin adequate, possibly secondary to vomiting/dehydration.  CMP with elevated glucose at 220, creatinine elevated at 1.41 and potassium of 3.0.  Labs repeated after IV fluids provided, potassium is improved to 3.8 after oral potassium.  Glucose improved to 128 and creatinine improved to 1.06 with improvement in GFR to greater than 60.  Urinalysis with ketones and protein, no evidence of infection.  Lipase within normal meds, hCG negative.   Imaging Studies ordered:  I ordered imaging studies including CT abdomen pelvis with contrast I independently visualized and interpreted imaging which showed possible enteritis I agree with the radiologist interpretation.  Discussed findings regarding patient's uterus, discussed importance of follow-up with her gynecologist.   Patient voices understanding.  Consultations Obtained:  I requested consultation with the ER attending, Dr. Zenia Resides,  and discussed lab and imaging findings as well as pertinent plan - they recommend: hydrate and follow up if tolerating POs and improved.    Problem List / ED Course / Critical interventions / Medication management  41 year old female presents with concern for vomiting and diarrhea with abdominal pain.  On exam, abdomen is soft, not significantly tender, no CVA tenderness.  Labs are reviewed, she is found to be dehydrated with an increased creatinine and mild hypokalemia.  Patient was given oral potassium replacement with improvement.  Given IV fluids with improvement in her renal function.  Was discussed with ER attending who agrees with plan for discharge home to follow with PCP.  Did discuss with patient the abnormal  findings regarding her uterus on her CT scan, patient does have a gynecologist and will follow-up further evaluation with this provider. I ordered medication including k-dur IV fluids for dehydration, hypokalemia Reevaluation of the patient after these medicines showed that the patient improved I have reviewed the patients home medicines and have made adjustments as needed   Social Determinants of Health:  Lives with family, has PCP   Test / Admission - Considered:  Consider admission for further monitoring and hydration however labs improved, patient is tolerating p.o.'s and able to follow-up with primary care provider with return to ER precautions provided.         Final Clinical Impression(s) / ED Diagnoses Final diagnoses:  Nausea vomiting and diarrhea  Dehydration  Hypokalemia    Rx / DC Orders ED Discharge Orders          Ordered    ondansetron (ZOFRAN-ODT) 4 MG disintegrating tablet  Every 8 hours PRN        10/12/21 1303              Tacy Learn, PA-C 10/12/21 1516    Lacretia Leigh, MD 10/16/21 1014

## 2021-10-12 NOTE — Discharge Instructions (Addendum)
Home to rest and hydrate. Recheck with your PCP on Monday. Return to the ER for worsening or concerning symptoms.  Zofran as needed as prescribed for nausea and vomiting.

## 2021-10-12 NOTE — ED Triage Notes (Signed)
Pt brought to ED with c/o generalized abdominal pain and vomiting since awakening this AM.

## 2021-10-12 NOTE — ED Provider Triage Note (Signed)
Emergency Medicine Provider Triage Evaluation Note  [name] , a 41 y.o. female  was evaluated in triage.  Pt complains of abd pain. Report sharp pain to mid abdomen that started today. Report n/v/d, cough.  No fever, or chills.  Recently started on bupropion to help with tobacco cessation  Review of Systems  Positive: As above Negative: As above  Physical Exam  BP (!) 115/98 (BP Location: Left Arm)   Pulse (!) 126   Temp 97.7 F (36.5 C) (Oral)   Resp 18   SpO2 100%  Gen:   Awake, no distress   Resp:  Normal effort  MSK:   Moves extremities without difficulty  Other:    Medical Decision Making  Medically screening exam initiated at 5:46 AM.  Appropriate orders placed.  [name] was informed that the remainder of the evaluation will be completed by another provider, this initial triage assessment does not replace that evaluation, and the importance of remaining in the ED until their evaluation is complete.     Domenic Moras, PA-C 10/12/21 [phone]

## 2021-10-12 NOTE — ED Notes (Signed)
Pt sitting in waiting room in wheelchair. Equal and Even respirations noted. NAD noted. 3/10 pain

## 2021-10-12 NOTE — ED Notes (Signed)
Patient had episode of emesis

## 2021-10-29 ENCOUNTER — Other Ambulatory Visit: Payer: Self-pay | Admitting: Family

## 2021-10-29 DIAGNOSIS — G8929 Other chronic pain: Secondary | ICD-10-CM

## 2021-10-30 NOTE — Telephone Encounter (Signed)
Requested medication (s) are due for refill today - provider review   Requested medication (s) are on the active medication list -yes  Future visit scheduled -no  Last refill: 08/30/21 #30 1RF  Notes to clinic: Rx for acute visit- sent for review if to continue Rx  Requested Prescriptions  Pending Prescriptions Disp Refills   DULoxetine (CYMBALTA) 20 MG capsule [Pharmacy Med Name: DULOXETINE DR 20MG CAPSULES] 30 capsule 1    Sig: TAKE 1 CAPSULE(20 MG) BY MOUTH DAILY     Psychiatry: Antidepressants - SNRI - duloxetine Failed - 10/29/2021  8:30 AM      Failed - Cr in normal range and within 360 days    Creatinine, Ser  Date Value Ref Range Status  10/12/2021 1.06 (H) 0.44 - 1.00 mg/dL Final         Failed - Last BP in normal range    BP Readings from Last 1 Encounters:  10/12/21 (!) 128/101         Passed - eGFR is 30 or above and within 360 days    GFR calc Af Amer  Date Value Ref Range Status  03/02/2020 130 >59 mL/min/1.73 Final    Comment:    **In accordance with recommendations from the NKF-ASN Task force,**   [facility] is in the process of updating its eGFR calculation to the   2021 CKD-EPI creatinine equation that estimates kidney function   without a race variable.    GFR, Estimated  Date Value Ref Range Status  10/12/2021 >60 >60 mL/min Final    Comment:    (NOTE) Calculated using the CKD-EPI Creatinine Equation (2021)    eGFR  Date Value Ref Range Status  09/15/2021 112 >59 mL/min/1.73 Final         Passed - Completed PHQ-2 or PHQ-9 in the last 360 days      Passed - Valid encounter within last 6 months    Recent Outpatient Visits           1 month ago Essential (primary) hypertension   [facility], Amy J, NP   2 months ago Essential (primary) hypertension   [facility], Amy J, NP   5 months ago Essential hypertension   [facility], Amy J, NP   11 months ago  Essential hypertension   [facility] Nichols, Tonya S, NP   11 months ago Elevated blood pressure reading in office without diagnosis of hypertension   [facility] Wilson, Amelia, MD               meloxicam (MOBIC) 7.5 MG tablet [Pharmacy Med Name: MELOXICAM 7.5MG TABLETS] 30 tablet 1    Sig: TAKE 1 TABLET(7.5 MG) BY MOUTH DAILY     Analgesics:  COX2 Inhibitors Failed - 10/29/2021  8:30 AM      Failed - Manual Review: Labs are only required if the patient has taken medication for more than 8 weeks.      Failed - HGB in normal range and within 360 days    Hemoglobin  Date Value Ref Range Status  10/12/2021 17.1 (H) 12.0 - 15.0 g/dL Final         Failed - Cr in normal range and within 360 days    Creatinine, Ser  Date Value Ref Range Status  10/12/2021 1.06 (H) 0.44 - 1.00 mg/dL Final         Failed -   HCT in normal range and within 360 days    HCT  Date Value Ref Range Status  10/12/2021 51.7 (H) 36.0 - 46.0 % Final         Passed - AST in normal range and within 360 days    AST  Date Value Ref Range Status  10/12/2021 17 15 - 41 U/L Final         Passed - ALT in normal range and within 360 days    ALT  Date Value Ref Range Status  10/12/2021 11 0 - 44 U/L Final         Passed - eGFR is 30 or above and within 360 days    GFR calc Af Amer  Date Value Ref Range Status  03/02/2020 130 >59 mL/min/1.73 Final    Comment:    **In accordance with recommendations from the NKF-ASN Task force,**   [facility] is in the process of updating its eGFR calculation to the   2021 CKD-EPI creatinine equation that estimates kidney function   without a race variable.    GFR, Estimated  Date Value Ref Range Status  10/12/2021 >60 >60 mL/min Final    Comment:    (NOTE) Calculated using the CKD-EPI Creatinine Equation (2021)    eGFR  Date Value Ref Range Status  09/15/2021 112 >59 mL/min/1.73 Final         Passed - Patient is not pregnant       Passed - Valid encounter within last 12 months    Recent Outpatient Visits           1 month ago Essential (primary) hypertension   [facility], Amy J, NP   2 months ago Essential (primary) hypertension   [facility], Amy J, NP   5 months ago Essential hypertension   [facility], Amy J, NP   11 months ago Essential hypertension   [facility], Tonya S, NP   11 months ago Elevated blood pressure reading in office without diagnosis of hypertension   [facility] Wilson, Amelia, MD                 Requested Prescriptions  Pending Prescriptions Disp Refills   DULoxetine (CYMBALTA) 20 MG capsule [Pharmacy Med Name: DULOXETINE DR 20MG CAPSULES] 30 capsule 1    Sig: TAKE 1 CAPSULE(20 MG) BY MOUTH DAILY     Psychiatry: Antidepressants - SNRI - duloxetine Failed - 10/29/2021  8:30 AM      Failed - Cr in normal range and within 360 days    Creatinine, Ser  Date Value Ref Range Status  10/12/2021 1.06 (H) 0.44 - 1.00 mg/dL Final         Failed - Last BP in normal range    BP Readings from Last 1 Encounters:  10/12/21 (!) 128/101         Passed - eGFR is 30 or above and within 360 days    GFR calc Af Amer  Date Value Ref Range Status  03/02/2020 130 >59 mL/min/1.73 Final    Comment:    **In accordance with recommendations from the NKF-ASN Task force,**   [facility] is in the process of updating its eGFR calculation to the   2021 CKD-EPI creatinine equation that estimates kidney function   without a race variable.    GFR, Estimated  Date Value Ref Range Status  10/12/2021 >60 >  60 mL/min Final    Comment:    (NOTE) Calculated using the CKD-EPI Creatinine Equation (2021)    eGFR  Date Value Ref Range Status  09/15/2021 112 >59 mL/min/1.73 Final         Passed - Completed PHQ-2 or PHQ-9 in the last 360 days      Passed - Valid encounter  within last 6 months    Recent Outpatient Visits           1 month ago Essential (primary) hypertension   [facility], Amy J, NP   2 months ago Essential (primary) hypertension   [facility], Amy J, NP   5 months ago Essential hypertension   [facility], Amy J, NP   11 months ago Essential hypertension   [facility], Tonya S, NP   11 months ago Elevated blood pressure reading in office without diagnosis of hypertension   [facility] Wilson, Amelia, MD               meloxicam (MOBIC) 7.5 MG tablet [Pharmacy Med Name: MELOXICAM 7.5MG TABLETS] 30 tablet 1    Sig: TAKE 1 TABLET(7.5 MG) BY MOUTH DAILY     Analgesics:  COX2 Inhibitors Failed - 10/29/2021  8:30 AM      Failed - Manual Review: Labs are only required if the patient has taken medication for more than 8 weeks.      Failed - HGB in normal range and within 360 days    Hemoglobin  Date Value Ref Range Status  10/12/2021 17.1 (H) 12.0 - 15.0 g/dL Final         Failed - Cr in normal range and within 360 days    Creatinine, Ser  Date Value Ref Range Status  10/12/2021 1.06 (H) 0.44 - 1.00 mg/dL Final         Failed - HCT in normal range and within 360 days    HCT  Date Value Ref Range Status  10/12/2021 51.7 (H) 36.0 - 46.0 % Final         Passed - AST in normal range and within 360 days    AST  Date Value Ref Range Status  10/12/2021 17 15 - 41 U/L Final         Passed - ALT in normal range and within 360 days    ALT  Date Value Ref Range Status  10/12/2021 11 0 - 44 U/L Final         Passed - eGFR is 30 or above and within 360 days    GFR calc Af Amer  Date Value Ref Range Status  03/02/2020 130 >59 mL/min/1.73 Final    Comment:    **In accordance with recommendations from the NKF-ASN Task force,**   [facility] is in the process of updating its eGFR calculation to the   2021  CKD-EPI creatinine equation that estimates kidney function   without a race variable.    GFR, Estimated  Date Value Ref Range Status  10/12/2021 >60 >60 mL/min Final    Comment:    (NOTE) Calculated using the CKD-EPI Creatinine Equation (2021)    eGFR  Date Value Ref Range Status  09/15/2021 112 >59 mL/min/1.73 Final         Passed - Patient is not pregnant      Passed - Valid encounter within last 12 months      Recent Outpatient Visits           1 month ago Essential (primary) hypertension   [facility], Amy J, NP   2 months ago Essential (primary) hypertension   [facility], Amy J, NP   5 months ago Essential hypertension   [facility], Amy J, NP   11 months ago Essential hypertension   [facility], Kriste Basque, NP   11 months ago Elevated blood pressure reading in office without diagnosis of hypertension   [facility], MD

## 2021-11-28 ENCOUNTER — Other Ambulatory Visit: Payer: Self-pay | Admitting: Family

## 2021-11-28 DIAGNOSIS — I10 Essential (primary) hypertension: Secondary | ICD-10-CM

## 2022-02-02 ENCOUNTER — Other Ambulatory Visit: Payer: Self-pay | Admitting: Family

## 2022-02-02 DIAGNOSIS — I10 Essential (primary) hypertension: Secondary | ICD-10-CM

## 2022-03-13 NOTE — Telephone Encounter (Unsigned)
Copied from [place] [phone]. Topic: General - Inquiry >> Mar 13, 2022  3:37 PM Marcellus Scott wrote: Reason for CRM: Pt is requesting a biometric screening at her upcoming appointment on 02/19.  No lab orders.  Please advise.

## 2022-03-23 NOTE — Progress Notes (Unsigned)
Patient ID: [name], female    DOB: 1980/12/13  MRN: [id number]  CC: Chronic Care Management   Subjective: [name] is a 42 y.o. female who presents for chronic care management.   Her concerns today include:  HTN - Losartan Smoking cessation - Bupropion   Patient Active Problem List   Diagnosis Date Noted   Acute pansinusitis 11/15/2020   Hidradenitis 11/15/2020   Essential hypertension 03/03/2020   Obesity (BMI 30.0-34.9) 03/03/2020     Current Outpatient Medications on File Prior to Visit  Medication Sig Dispense Refill   acetaminophen (TYLENOL) 500 MG tablet Take 1,000 mg by mouth daily as needed for headache.     DULoxetine (CYMBALTA) 20 MG capsule TAKE 1 CAPSULE(20 MG) BY MOUTH DAILY 30 capsule 1   losartan (COZAAR) 50 MG tablet TAKE 1 TABLET(50 MG) BY MOUTH DAILY 90 tablet 0   meloxicam (MOBIC) 7.5 MG tablet TAKE 1 TABLET(7.5 MG) BY MOUTH DAILY 30 tablet 1   ondansetron (ZOFRAN-ODT) 4 MG disintegrating tablet Take 1 tablet (4 mg total) by mouth every 8 (eight) hours as needed for nausea or vomiting. 12 tablet 0   No current facility-administered medications on file prior to visit.    No Known Allergies  Social History   Socioeconomic History   Marital status: Married    Spouse name: Not on file   Number of children: Not on file   Years of education: Not on file   Highest education level: Not on file  Occupational History   Not on file  Tobacco Use   Smoking status: Former    Packs/day: 0.50    Years: 15.00    Total pack years: 7.50    Types: Cigarettes    Quit date: 07/06/2020    Years since quitting: 1.7    Passive exposure: Past   Smokeless tobacco: Never  Substance and Sexual Activity   Alcohol use: Yes    Comment: occasional wine 2/month   Drug use: No   Sexual activity: Yes    Birth control/protection: Implant    Comment: implanon  Other Topics Concern   Not on file  Social History Narrative   Not on file   Social Determinants  of Health   Financial Resource Strain: Not on file  Food Insecurity: Not on file  Transportation Needs: Not on file  Physical Activity: Not on file  Stress: Not on file  Social Connections: Not on file  Intimate Partner Violence: Not on file    Family History  Problem Relation Age of Onset   Hypertension Mother    Heart failure Father 69   Diabetes Maternal Grandmother     Past Surgical History:  Procedure Laterality Date   BACK SURGERY     rods in lower back   DILATATION & CURETTAGE/HYSTEROSCOPY WITH MYOSURE N/A 10/27/2015   Procedure: DILATATION & CURETTAGE/HYSTEROSCOPY WITH MYOSURE WITH RESECTION OF FIBROID;  Surgeon: Servando Salina, MD;  Location: [facility];  Service: Gynecology;  Laterality: N/A;  Requests 1 hr.   WISDOM TOOTH EXTRACTION      ROS: Review of Systems Negative except as stated above  PHYSICAL EXAM: There were no vitals taken for this visit.  Physical Exam  {female adult master:310786} {female adult master:310785}     Latest Ref Rng & Units 10/12/2021   12:46 PM 10/12/2021    5:54 AM 09/15/2021    4:04 PM  CMP  Glucose 70 - 99 mg/dL 128  220  85   BUN  6 - 20 mg/dL 6  5  7   $ Creatinine 0.44 - 1.00 mg/dL 1.06  1.41  0.70   Sodium 135 - 145 mmol/L 139  138  143   Potassium 3.5 - 5.1 mmol/L 3.8  3.0  3.7   Chloride 98 - 111 mmol/L 115  107  105   CO2 22 - 32 mmol/L 21  17  21   $ Calcium 8.9 - 10.3 mg/dL 7.5  9.9  9.4   Total Protein 6.5 - 8.1 g/dL  7.6    Total Bilirubin 0.3 - 1.2 mg/dL  0.4    Alkaline Phos 38 - 126 U/L  57    AST 15 - 41 U/L  17    ALT 0 - 44 U/L  11     Lipid Panel     Component Value Date/Time   CHOL 198 11/15/2020 1020   TRIG 103 11/15/2020 1020   HDL 54 11/15/2020 1020   CHOLHDL 3.7 11/15/2020 1020   LDLCALC 126 (H) 11/15/2020 1020    CBC    Component Value Date/Time   WBC 8.7 10/12/2021 0554   RBC 6.13 (H) 10/12/2021 0554   HGB 17.1 (H) 10/12/2021 0554   HCT 51.7 (H) 10/12/2021 0554   PLT 394 10/12/2021 0554    MCV 84.3 10/12/2021 0554   MCH 27.9 10/12/2021 0554   MCHC 33.1 10/12/2021 0554   RDW 13.2 10/12/2021 0554   LYMPHSABS 3.5 10/12/2021 0554   MONOABS 0.4 10/12/2021 0554   EOSABS 0.1 10/12/2021 0554   BASOSABS 0.0 10/12/2021 0554    ASSESSMENT AND PLAN:  There are no diagnoses linked to this encounter.   Patient was given the opportunity to ask questions.  Patient verbalized understanding of the plan and was able to repeat key elements of the plan. Patient was given clear instructions to go to Emergency Department or return to medical center if symptoms don't improve, worsen, or new problems develop.The patient verbalized understanding.   No orders of the defined types were placed in this encounter.    Requested Prescriptions    No prescriptions requested or ordered in this encounter    No follow-ups on file.  [name], NP

## 2022-03-26 ENCOUNTER — Ambulatory Visit (INDEPENDENT_AMBULATORY_CARE_PROVIDER_SITE_OTHER): Payer: 59 | Admitting: Family

## 2022-03-26 VITALS — BP 121/85 | HR 79 | Temp 98.3°F | Resp 16 | Wt 175.0 lb

## 2022-03-26 DIAGNOSIS — Z131 Encounter for screening for diabetes mellitus: Secondary | ICD-10-CM

## 2022-03-26 DIAGNOSIS — I10 Essential (primary) hypertension: Secondary | ICD-10-CM

## 2022-03-26 DIAGNOSIS — Z13220 Encounter for screening for lipoid disorders: Secondary | ICD-10-CM

## 2022-03-26 MED ORDER — LOSARTAN POTASSIUM 50 MG PO TABS: 50.0000 mg | ORAL_TABLET | Freq: Every day | ORAL | 2 refills | Status: DC

## 2022-03-26 NOTE — Progress Notes (Signed)
.  Pt presents for chronic care management

## 2022-03-27 ENCOUNTER — Other Ambulatory Visit: Payer: Self-pay | Admitting: Family

## 2022-03-27 DIAGNOSIS — E785 Hyperlipidemia, unspecified: Secondary | ICD-10-CM | POA: Insufficient documentation

## 2022-03-27 DIAGNOSIS — R7303 Prediabetes: Secondary | ICD-10-CM

## 2022-03-27 LAB — LIPID PANEL
Chol/HDL Ratio: 4.6 ratio — ABNORMAL HIGH (ref 0.0–4.4)
Cholesterol, Total: 206 mg/dL — ABNORMAL HIGH (ref 100–199)
HDL: 45 mg/dL (ref >39)
LDL Chol Calc (NIH): 142 mg/dL — ABNORMAL HIGH (ref 0–99)
Triglycerides: 104 mg/dL (ref 0–149)
VLDL Cholesterol Cal: 19 mg/dL (ref 5–40)

## 2022-03-27 LAB — BASIC METABOLIC PANEL
BUN/Creatinine Ratio: 10 (ref 9–23)
BUN: 7 mg/dL (ref 6–24)
CO2: 19 mmol/L — ABNORMAL LOW (ref 20–29)
Calcium: 9.1 mg/dL (ref 8.7–10.2)
Chloride: 104 mmol/L (ref 96–106)
Creatinine, Ser: 0.73 mg/dL (ref 0.57–1.00)
Glucose: 79 mg/dL (ref 70–99)
Potassium: 3.7 mmol/L (ref 3.5–5.2)
Sodium: 138 mmol/L (ref 134–144)
eGFR: 107 mL/min/{1.73_m2} (ref >59)

## 2022-03-27 LAB — HEMOGLOBIN A1C
Est. average glucose Bld gHb Est-mCnc: 120 mg/dL
Hgb A1c MFr Bld: 5.8 % — ABNORMAL HIGH (ref 4.8–5.6)

## 2022-03-27 MED ORDER — ATORVASTATIN CALCIUM 20 MG PO TABS: 20.0000 mg | ORAL_TABLET | Freq: Every day | ORAL | 2 refills | Status: DC

## 2022-05-15 ENCOUNTER — Other Ambulatory Visit: Payer: 59

## 2022-05-15 DIAGNOSIS — E785 Hyperlipidemia, unspecified: Secondary | ICD-10-CM

## 2022-05-16 LAB — LIPID PANEL
Chol/HDL Ratio: 2.8 ratio (ref 0.0–4.4)
Cholesterol, Total: 130 mg/dL (ref 100–199)
HDL: 47 mg/dL (ref >39)
LDL Chol Calc (NIH): 62 mg/dL (ref 0–99)
Triglycerides: 117 mg/dL (ref 0–149)
VLDL Cholesterol Cal: 21 mg/dL (ref 5–40)

## 2022-06-18 ENCOUNTER — Other Ambulatory Visit: Payer: Self-pay | Admitting: Family

## 2022-06-18 DIAGNOSIS — E785 Hyperlipidemia, unspecified: Secondary | ICD-10-CM

## 2022-06-18 NOTE — Telephone Encounter (Signed)
Complete

## 2022-07-16 ENCOUNTER — Other Ambulatory Visit: Payer: Self-pay | Admitting: Family

## 2022-07-16 DIAGNOSIS — I10 Essential (primary) hypertension: Secondary | ICD-10-CM

## 2022-07-16 NOTE — Telephone Encounter (Signed)
Copied from CRM [id number]. Topic: General - Other >> Jul 16, 2022 12:48 PM Everette C wrote: Reason for CRM: Medication Refill - Medication: losartan (COZAAR) 50 MG tablet [[id number]]   Has the patient contacted their pharmacy? Yes.   (Agent: If no, request that the patient contact the pharmacy for the refill. If patient does not wish to contact the pharmacy document the reason why and proceed with request.) (Agent: If yes, when and what did the pharmacy advise?)  Preferred Pharmacy (with phone number or street name): [id number] [place], [place] - [address] AT [facility] [address] & [facility] [address] [place] [address] Phone: [phone] Fax: [phone] Hours: Not open 24 hours   Has the patient been seen for an appointment in the last year OR does the patient have an upcoming appointment? Yes.    Agent: Please be advised that RX refills may take up to 3 business days. We ask that you follow-up with your pharmacy.

## 2022-07-17 NOTE — Telephone Encounter (Signed)
Requested medications are due for refill today.  yes  Requested medications are on the active medications list.  yes  Last refill. 03/26/2022 #30 2 rf  Future visit scheduled.   yes  Notes to clinic.  Rx written to expire 06/24/2022 - Rx is expired.    Requested Prescriptions  Pending Prescriptions Disp Refills   losartan (COZAAR) 50 MG tablet 30 tablet 2    Sig: Take 1 tablet (50 mg total) by mouth daily.     Cardiovascular:  Angiotensin Receptor Blockers Passed - 07/16/2022  2:16 PM      Passed - Cr in normal range and within 180 days    Creatinine, Ser  Date Value Ref Range Status  03/26/2022 0.73 0.57 - 1.00 mg/dL Final         Passed - K in normal range and within 180 days    Potassium  Date Value Ref Range Status  03/26/2022 3.7 3.5 - 5.2 mmol/L Final         Passed - Patient is not pregnant      Passed - Last BP in normal range    BP Readings from Last 1 Encounters:  03/26/22 121/85         Passed - Valid encounter within last 6 months    Recent Outpatient Visits           3 months ago Primary hypertension   [facility], Amy J, NP   10 months ago Essential (primary) hypertension   [place] [facility], Amy J, NP   10 months ago Essential (primary) hypertension   [facility], [place], NP   1 year ago Essential hypertension   [facility], [place], NP   1 year ago Essential hypertension   [place] [facility], Gildardo Pounds, NP       Future Appointments             In 1 week Rema Fendt, NP [facility]

## 2022-07-20 ENCOUNTER — Other Ambulatory Visit: Payer: Self-pay | Admitting: Family

## 2022-07-20 DIAGNOSIS — I10 Essential (primary) hypertension: Secondary | ICD-10-CM

## 2022-07-20 NOTE — Telephone Encounter (Signed)
Complete

## 2022-07-23 NOTE — Progress Notes (Unsigned)
Patient ID: [name], female    DOB: 08-22-1980  MRN: [id number]  CC: Annual Physical Exam  Subjective: [name] is a 42 y.o. female who presents for annual physical exam.   Her concerns today include:  HTN - Losartan  Mammo  Pap   Patient Active Problem List   Diagnosis Date Noted   Hyperlipidemia 03/27/2022   Prediabetes 03/27/2022   Acute pansinusitis 11/15/2020   Hidradenitis 11/15/2020   Essential hypertension 03/03/2020   Obesity (BMI 30.0-34.9) 03/03/2020     Current Outpatient Medications on File Prior to Visit  Medication Sig Dispense Refill   acetaminophen (TYLENOL) 500 MG tablet Take 1,000 mg by mouth daily as needed for headache.     atorvastatin (LIPITOR) 20 MG tablet TAKE 1 TABLET(20 MG) BY MOUTH DAILY 90 tablet 0   DULoxetine (CYMBALTA) 20 MG capsule TAKE 1 CAPSULE(20 MG) BY MOUTH DAILY 30 capsule 1   losartan (COZAAR) 50 MG tablet TAKE 1 TABLET(50 MG) BY MOUTH DAILY 90 tablet 0   meloxicam (MOBIC) 7.5 MG tablet TAKE 1 TABLET(7.5 MG) BY MOUTH DAILY 30 tablet 1   ondansetron (ZOFRAN-ODT) 4 MG disintegrating tablet Take 1 tablet (4 mg total) by mouth every 8 (eight) hours as needed for nausea or vomiting. 12 tablet 0   No current facility-administered medications on file prior to visit.    No Known Allergies  Social History   Socioeconomic History   Marital status: Married    Spouse name: Not on file   Number of children: Not on file   Years of education: Not on file   Highest education level: Not on file  Occupational History   Not on file  Tobacco Use   Smoking status: Former    Packs/day: 0.50    Years: 15.00    Additional pack years: 0.00    Total pack years: 7.50    Types: Cigarettes    Quit date: 07/06/2020    Years since quitting: 2.0    Passive exposure: Past   Smokeless tobacco: Never  Substance and Sexual Activity   Alcohol use: Yes    Comment: occasional wine 2/month   Drug use: No   Sexual activity: Yes    Birth  control/protection: Implant    Comment: implanon  Other Topics Concern   Not on file  Social History Narrative   Not on file   Social Determinants of Health   Financial Resource Strain: Not on file  Food Insecurity: Not on file  Transportation Needs: Not on file  Physical Activity: Not on file  Stress: Not on file  Social Connections: Not on file  Intimate Partner Violence: Not on file    Family History  Problem Relation Age of Onset   Hypertension Mother    Heart failure Father 89   Diabetes Maternal Grandmother     Past Surgical History:  Procedure Laterality Date   BACK SURGERY     rods in lower back   DILATATION & CURETTAGE/HYSTEROSCOPY WITH MYOSURE N/A 10/27/2015   Procedure: DILATATION & CURETTAGE/HYSTEROSCOPY WITH MYOSURE WITH RESECTION OF FIBROID;  Surgeon: Maxie Better, MD;  Location: [facility];  Service: Gynecology;  Laterality: N/A;  Requests 1 hr.   WISDOM TOOTH EXTRACTION      ROS: Review of Systems Negative except as stated above  PHYSICAL EXAM: There were no vitals taken for this visit.  Physical Exam  {female adult master:310786} {female adult master:310785}     Latest Ref Rng & Units 03/26/2022  3:46 PM 10/12/2021   12:46 PM 10/12/2021    5:54 AM  CMP  Glucose 70 - 99 mg/dL 79  295  621   BUN 6 - 24 mg/dL 7  6  5    Creatinine 0.57 - 1.00 mg/dL 3.08  6.57  8.46   Sodium 134 - 144 mmol/L 138  139  138   Potassium 3.5 - 5.2 mmol/L 3.7  3.8  3.0   Chloride 96 - 106 mmol/L 104  115  107   CO2 20 - 29 mmol/L 19  21  17    Calcium 8.7 - 10.2 mg/dL 9.1  7.5  9.9   Total Protein 6.5 - 8.1 g/dL   7.6   Total Bilirubin 0.3 - 1.2 mg/dL   0.4   Alkaline Phos 38 - 126 U/L   57   AST 15 - 41 U/L   17   ALT 0 - 44 U/L   11    Lipid Panel     Component Value Date/Time   CHOL 130 05/15/2022 0828   TRIG 117 05/15/2022 0828   HDL 47 05/15/2022 0828   CHOLHDL 2.8 05/15/2022 0828   LDLCALC 62 05/15/2022 0828    CBC    Component Value Date/Time    WBC 8.7 10/12/2021 0554   RBC 6.13 (H) 10/12/2021 0554   HGB 17.1 (H) 10/12/2021 0554   HCT 51.7 (H) 10/12/2021 0554   PLT 394 10/12/2021 0554   MCV 84.3 10/12/2021 0554   MCH 27.9 10/12/2021 0554   MCHC 33.1 10/12/2021 0554   RDW 13.2 10/12/2021 0554   LYMPHSABS 3.5 10/12/2021 0554   MONOABS 0.4 10/12/2021 0554   EOSABS 0.1 10/12/2021 0554   BASOSABS 0.0 10/12/2021 0554    ASSESSMENT AND PLAN:  There are no diagnoses linked to this encounter.   Patient was given the opportunity to ask questions.  Patient verbalized understanding of the plan and was able to repeat key elements of the plan. Patient was given clear instructions to go to Emergency Department or return to medical center if symptoms don't improve, worsen, or new problems develop.The patient verbalized understanding.   No orders of the defined types were placed in this encounter.    Requested Prescriptions    No prescriptions requested or ordered in this encounter    No follow-ups on file.  Rema Fendt, NP

## 2022-07-24 ENCOUNTER — Encounter: Payer: Self-pay | Admitting: Family

## 2022-07-24 ENCOUNTER — Ambulatory Visit (INDEPENDENT_AMBULATORY_CARE_PROVIDER_SITE_OTHER): Payer: 59 | Admitting: Family

## 2022-07-24 VITALS — BP 144/93 | HR 84 | Temp 98.2°F | Ht 62.0 in | Wt 168.6 lb

## 2022-07-24 DIAGNOSIS — Z0000 Encounter for general adult medical examination without abnormal findings: Secondary | ICD-10-CM

## 2022-07-24 DIAGNOSIS — Z130 Encounter for screening for diseases of the blood and blood-forming organs and certain disorders involving the immune mechanism: Secondary | ICD-10-CM

## 2022-07-24 DIAGNOSIS — E785 Hyperlipidemia, unspecified: Secondary | ICD-10-CM

## 2022-07-24 DIAGNOSIS — Z0001 Encounter for general adult medical examination with abnormal findings: Secondary | ICD-10-CM | POA: Diagnosis not present

## 2022-07-24 DIAGNOSIS — Z113 Encounter for screening for infections with a predominantly sexual mode of transmission: Secondary | ICD-10-CM

## 2022-07-24 DIAGNOSIS — Z1329 Encounter for screening for other suspected endocrine disorder: Secondary | ICD-10-CM

## 2022-07-24 DIAGNOSIS — I10 Essential (primary) hypertension: Secondary | ICD-10-CM | POA: Diagnosis not present

## 2022-07-24 DIAGNOSIS — Z1159 Encounter for screening for other viral diseases: Secondary | ICD-10-CM

## 2022-07-24 DIAGNOSIS — Z114 Encounter for screening for human immunodeficiency virus [HIV]: Secondary | ICD-10-CM

## 2022-07-24 DIAGNOSIS — Z1231 Encounter for screening mammogram for malignant neoplasm of breast: Secondary | ICD-10-CM

## 2022-07-24 DIAGNOSIS — Z124 Encounter for screening for malignant neoplasm of cervix: Secondary | ICD-10-CM

## 2022-07-24 DIAGNOSIS — Z13228 Encounter for screening for other metabolic disorders: Secondary | ICD-10-CM

## 2022-07-24 NOTE — Patient Instructions (Signed)
Preventive Care 40-41 Years Old, Female Preventive care refers to lifestyle choices and visits with your health care provider that can promote health and wellness. Preventive care visits are also called wellness exams. What can I expect for my preventive care visit? Counseling Your health care provider may ask you questions about your: Medical history, including: Past medical problems. Family medical history. Pregnancy history. Current health, including: Menstrual cycle. Method of birth control. Emotional well-being. Home life and relationship well-being. Sexual activity and sexual health. Lifestyle, including: Alcohol, nicotine or tobacco, and drug use. Access to firearms. Diet, exercise, and sleep habits. Work and work environment. Sunscreen use. Safety issues such as seatbelt and bike helmet use. Physical exam Your health care provider will check your: Height and weight. These may be used to calculate your BMI (body mass index). BMI is a measurement that tells if you are at a healthy weight. Waist circumference. This measures the distance around your waistline. This measurement also tells if you are at a healthy weight and may help predict your risk of certain diseases, such as type 2 diabetes and high blood pressure. Heart rate and blood pressure. Body temperature. Skin for abnormal spots. What immunizations do I need?  Vaccines are usually given at various ages, according to a schedule. Your health care provider will recommend vaccines for you based on your age, medical history, and lifestyle or other factors, such as travel or where you work. What tests do I need? Screening Your health care provider may recommend screening tests for certain conditions. This may include: Lipid and cholesterol levels. Diabetes screening. This is done by checking your blood sugar (glucose) after you have not eaten for a while (fasting). Pelvic exam and Pap test. Hepatitis B test. Hepatitis C  test. HIV (human immunodeficiency virus) test. STI (sexually transmitted infection) testing, if you are at risk. Lung cancer screening. Colorectal cancer screening. Mammogram. Talk with your health care provider about when you should start having regular mammograms. This may depend on whether you have a family history of breast cancer. BRCA-related cancer screening. This may be done if you have a family history of breast, ovarian, tubal, or peritoneal cancers. Bone density scan. This is done to screen for osteoporosis. Talk with your health care provider about your test results, treatment options, and if necessary, the need for more tests. Follow these instructions at home: Eating and drinking  Eat a diet that includes fresh fruits and vegetables, whole grains, lean protein, and low-fat dairy products. Take vitamin and mineral supplements as recommended by your health care provider. Do not drink alcohol if: Your health care provider tells you not to drink. You are pregnant, may be pregnant, or are planning to become pregnant. If you drink alcohol: Limit how much you have to 0-1 drink a day. Know how much alcohol is in your drink. In the [place] one drink equals one 12 oz bottle of beer (355 mL), one 5 oz glass of wine (148 mL), or one 1 oz glass of hard liquor (44 mL). Lifestyle Brush your teeth every morning and night with fluoride toothpaste. Floss one time each day. Exercise for at least 30 minutes 5 or more days each week. Do not use any products that contain nicotine or tobacco. These products include cigarettes, chewing tobacco, and vaping devices, such as e-cigarettes. If you need help quitting, ask your health care provider. Do not use drugs. If you are sexually active, practice safe sex. Use a condom or other form of protection to   prevent STIs. If you do not wish to become pregnant, use a form of birth control. If you plan to become pregnant, see your health care provider for a  prepregnancy visit. Take aspirin only as told by your health care provider. Make sure that you understand how much to take and what form to take. Work with your health care provider to find out whether it is safe and beneficial for you to take aspirin daily. Find healthy ways to manage stress, such as: Meditation, yoga, or listening to music. Journaling. Talking to a trusted person. Spending time with friends and family. Minimize exposure to UV radiation to reduce your risk of skin cancer. Safety Always wear your seat belt while driving or riding in a vehicle. Do not drive: If you have been drinking alcohol. Do not ride with someone who has been drinking. When you are tired or distracted. While texting. If you have been using any mind-altering substances or drugs. Wear a helmet and other protective equipment during sports activities. If you have firearms in your house, make sure you follow all gun safety procedures. Seek help if you have been physically or sexually abused. What's next? Visit your health care provider once a year for an annual wellness visit. Ask your health care provider how often you should have your eyes and teeth checked. Stay up to date on all vaccines. This information is not intended to replace advice given to you by your health care provider. Make sure you discuss any questions you have with your health care provider. Document Revised: 07/20/2020 Document Reviewed: 07/20/2020 Elsevier Patient Education  2024 [employer] Inc[employer]  

## 2022-07-25 ENCOUNTER — Other Ambulatory Visit: Payer: Self-pay | Admitting: Family

## 2022-07-25 DIAGNOSIS — E785 Hyperlipidemia, unspecified: Secondary | ICD-10-CM

## 2022-07-25 LAB — CBC
Hematocrit: 39.4 % (ref 34.0–46.6)
Hemoglobin: 13.3 g/dL (ref 11.1–15.9)
MCH: 28.1 pg (ref 26.6–33.0)
MCHC: 33.8 g/dL (ref 31.5–35.7)
MCV: 83 fL (ref 79–97)
Platelets: 324 10*3/uL (ref 150–450)
RBC: 4.73 x10E6/uL (ref 3.77–5.28)
RDW: 13.1 % (ref 11.7–15.4)
WBC: 9.2 10*3/uL (ref 3.4–10.8)

## 2022-07-25 LAB — HEPATIC FUNCTION PANEL
ALT: 24 IU/L (ref 0–32)
AST: 19 IU/L (ref 0–40)
Albumin: 4.6 g/dL (ref 3.9–4.9)
Alkaline Phosphatase: 84 IU/L (ref 44–121)
Bilirubin Total: 0.2 mg/dL (ref 0.0–1.2)
Bilirubin, Direct: 0.1 mg/dL (ref 0.00–0.40)
Total Protein: 7.1 g/dL (ref 6.0–8.5)

## 2022-07-25 LAB — LIPID PANEL
Chol/HDL Ratio: 3.9 ratio (ref 0.0–4.4)
Cholesterol, Total: 172 mg/dL (ref 100–199)
HDL: 44 mg/dL (ref >39)
LDL Chol Calc (NIH): 97 mg/dL (ref 0–99)
Triglycerides: 179 mg/dL — ABNORMAL HIGH (ref 0–149)
VLDL Cholesterol Cal: 31 mg/dL (ref 5–40)

## 2022-07-25 LAB — HIV ANTIBODY (ROUTINE TESTING W REFLEX): HIV Screen 4th Generation wRfx: NONREACTIVE

## 2022-07-25 LAB — TSH: TSH: 0.912 u[IU]/mL (ref 0.450–4.500)

## 2022-07-25 LAB — HEPATITIS C ANTIBODY: Hep C Virus Ab: NONREACTIVE

## 2022-07-25 MED ORDER — ATORVASTATIN CALCIUM 20 MG PO TABS: ORAL_TABLET | ORAL | 0 refills | Status: DC

## 2022-08-05 IMAGING — DX DG FOOT COMPLETE 3+V*L*
3 series · 3 of 3 positions shown · non-contrast
Comparison: None.

CLINICAL DATA: Left foot injury

EXAM:
LEFT FOOT - COMPLETE 3+ VIEW

[foot ap]
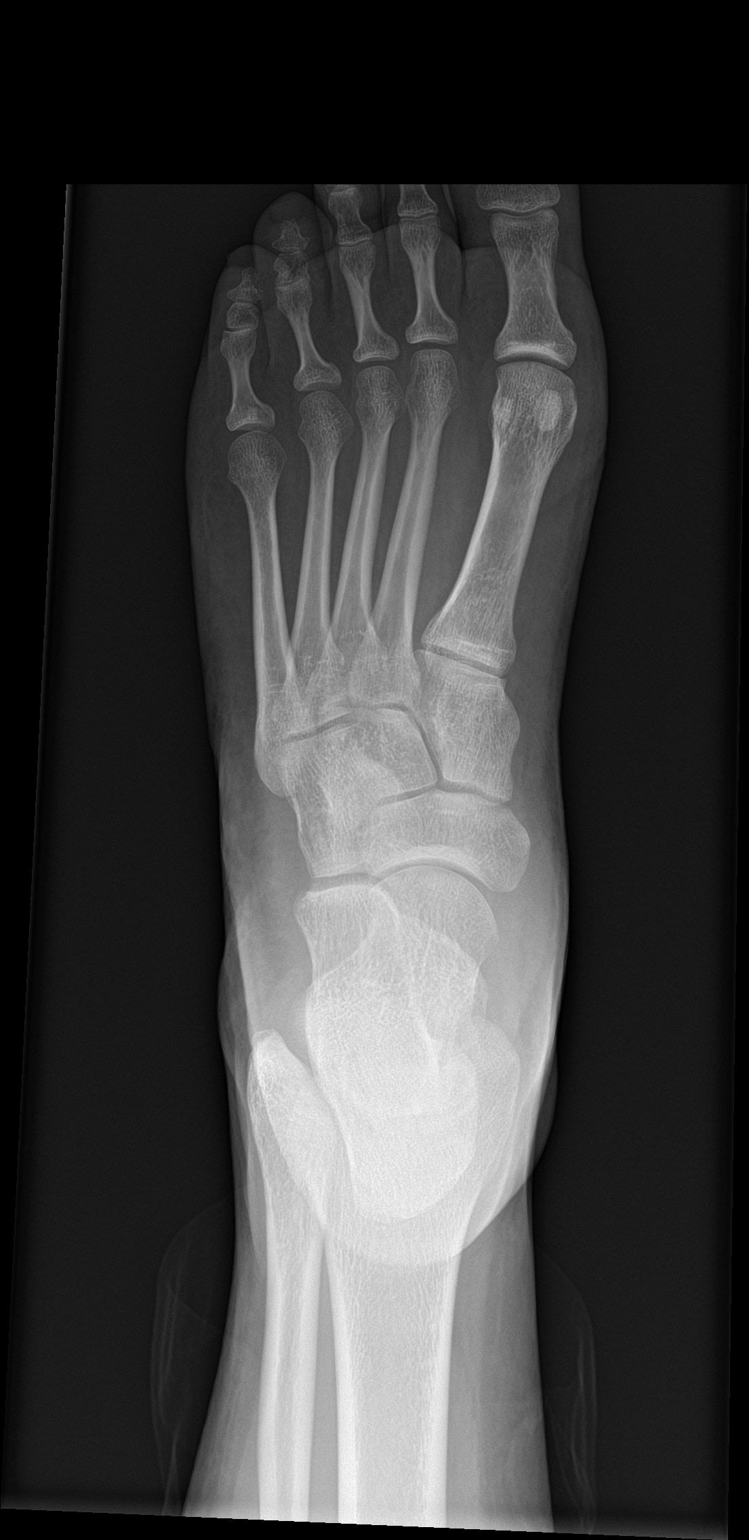

[foot obl]
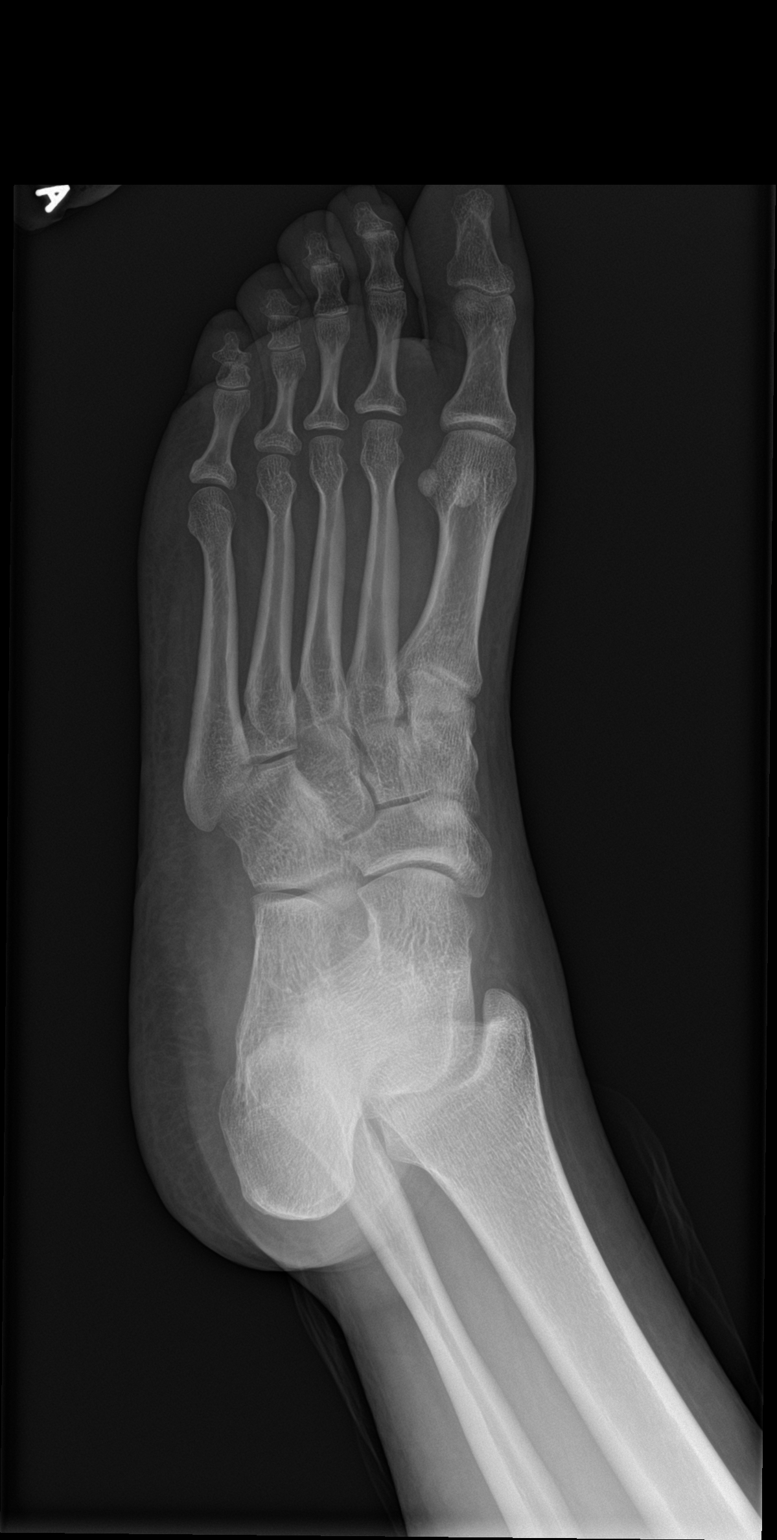

[foot lat]
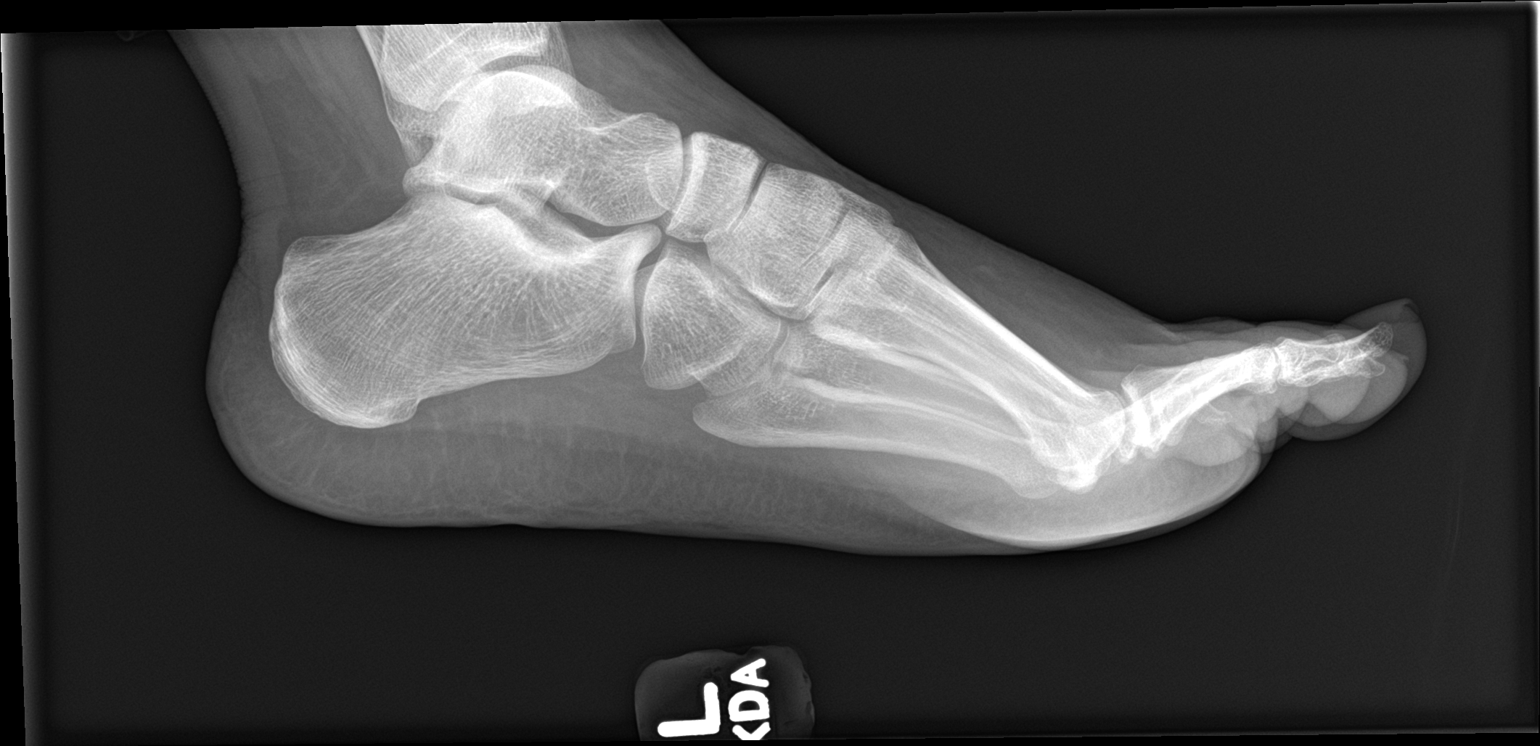

[3 of 3 positions shown; findings below may reference images not displayed]

FINDINGS: There is no evidence of fracture or dislocation. There is no
evidence of arthropathy or other focal bone abnormality. Soft
tissues are unremarkable.
IMPRESSION: Negative.

## 2022-08-21 ENCOUNTER — Ambulatory Visit (INDEPENDENT_AMBULATORY_CARE_PROVIDER_SITE_OTHER): Payer: 59 | Admitting: Family

## 2022-08-21 VITALS — BP 129/89 | HR 77 | Temp 98.1°F | Ht 61.0 in | Wt 167.4 lb

## 2022-08-21 DIAGNOSIS — E785 Hyperlipidemia, unspecified: Secondary | ICD-10-CM

## 2022-08-21 DIAGNOSIS — I10 Essential (primary) hypertension: Secondary | ICD-10-CM

## 2022-08-21 MED ORDER — ATORVASTATIN CALCIUM 20 MG PO TABS
20.0000 mg | ORAL_TABLET | Freq: Every day | ORAL | 0 refills | Status: DC
Start: 2022-08-21 — End: 2023-08-23

## 2022-08-21 MED ORDER — LOSARTAN POTASSIUM 50 MG PO TABS: 50.0000 mg | ORAL_TABLET | Freq: Every day | ORAL | 0 refills | Status: DC

## 2022-08-21 NOTE — Progress Notes (Signed)
Patient ID: [name], female    DOB: 1980/03/04  MRN: [id number]  CC: Blood Pressure Check  Subjective: [name] is a 42 y.o. female who presents for blood pressure check.  Her concerns today include:  - Doing well on Losartan, no issues/concerns. She does not check blood pressure outside of office. She does not complain of red flag symptoms such as but not limited to chest pain, shortness of breath, worst headache of life, nausea/vomiting.  - Doing well on Atorvastatin, no issues/concerns.  - No further issues/concerns for discussion today.   Patient Active Problem List   Diagnosis Date Noted   Hyperlipidemia 03/27/2022   Prediabetes 03/27/2022   Acute pansinusitis 11/15/2020   Hidradenitis 11/15/2020   Essential hypertension 03/03/2020   Obesity (BMI 30.0-34.9) 03/03/2020     Current Outpatient Medications on File Prior to Visit  Medication Sig Dispense Refill   acetaminophen (TYLENOL) 500 MG tablet Take 1,000 mg by mouth daily as needed for headache.     ondansetron (ZOFRAN-ODT) 4 MG disintegrating tablet Take 1 tablet (4 mg total) by mouth every 8 (eight) hours as needed for nausea or vomiting. (Patient not taking: Reported on 07/24/2022) 12 tablet 0   No current facility-administered medications on file prior to visit.    No Known Allergies  Social History   Socioeconomic History   Marital status: Married    Spouse name: Not on file   Number of children: Not on file   Years of education: Not on file   Highest education level: Some college, no degree  Occupational History   Not on file  Tobacco Use   Smoking status: Former    Current packs/day: 0.00    Average packs/day: 0.5 packs/day for 15.0 years (7.5 ttl pk-yrs)    Types: Cigarettes    Start date: 07/06/2005    Quit date: 07/06/2020    Years since quitting: 2.1    Passive exposure: Past   Smokeless tobacco: Never  Substance and Sexual Activity   Alcohol use: Yes    Comment: occasional wine  2/month   Drug use: No   Sexual activity: Yes    Birth control/protection: Implant    Comment: implanon  Other Topics Concern   Not on file  Social History Narrative   Not on file   Social Determinants of Health   Financial Resource Strain: Low Risk  (08/21/2022)   Overall Financial Resource Strain (CARDIA)    Difficulty of Paying Living Expenses: Not hard at all  Food Insecurity: No Food Insecurity (08/21/2022)   Hunger Vital Sign    Worried About Running Out of Food in the Last Year: Never true    Ran Out of Food in the Last Year: Never true  Transportation Needs: No Transportation Needs (08/21/2022)   PRAPARE - Administrator, Civil Service (Medical): No    Lack of Transportation (Non-Medical): No  Physical Activity: Insufficiently Active (08/21/2022)   Exercise Vital Sign    Days of Exercise per Week: 3 days    Minutes of Exercise per Session: 40 min  Stress: No Stress Concern Present (08/21/2022)   [employer] of Occupational Health - Occupational Stress Questionnaire    Feeling of Stress : Not at all  Social Connections: Moderately Integrated (08/21/2022)   Social Connection and Isolation Panel [NHANES]    Frequency of Communication with Friends and Family: More than three times a week    Frequency of Social Gatherings with Friends and Family: Twice  a week    Attends Religious Services: 1 to 4 times per year    Active Member of Clubs or Organizations: No    Attends Banker Meetings: Not on file    Marital Status: Married  Intimate Partner Violence: Unknown (05/12/2021)   Received from [facility]   HITS    Physically Hurt: Not on file    Insult or Talk Down To: Not on file    Threaten Physical Harm: Not on file    Scream or Curse: Not on file    Family History  Problem Relation Age of Onset   Hypertension Mother    Heart failure Father 32   Diabetes Maternal Grandmother     Past Surgical History:  Procedure Laterality Date   BACK  SURGERY     rods in lower back   DILATATION & CURETTAGE/HYSTEROSCOPY WITH MYOSURE N/A 10/27/2015   Procedure: DILATATION & CURETTAGE/HYSTEROSCOPY WITH MYOSURE WITH RESECTION OF FIBROID;  Surgeon: Maxie Better, MD;  Location: [facility];  Service: Gynecology;  Laterality: N/A;  Requests 1 hr.   WISDOM TOOTH EXTRACTION      ROS: Review of Systems Negative except as stated above  PHYSICAL EXAM: BP 129/89   Pulse 77   Temp 98.1 F (36.7 C) (Oral)   Ht 5\' 1"  (1.549 m)   Wt 167 lb 6.4 oz (75.9 kg)   LMP 08/11/2022 (Exact Date)   SpO2 97%   BMI 31.63 kg/m   Physical Exam HENT:     Head: Normocephalic and atraumatic.     Nose: Nose normal.     Mouth/Throat:     Mouth: Mucous membranes are moist.     Pharynx: Oropharynx is clear.  Eyes:     Extraocular Movements: Extraocular movements intact.     Conjunctiva/sclera: Conjunctivae normal.     Pupils: Pupils are equal, round, and reactive to light.  Cardiovascular:     Rate and Rhythm: Normal rate and regular rhythm.     Pulses: Normal pulses.     Heart sounds: Normal heart sounds.  Pulmonary:     Effort: Pulmonary effort is normal.     Breath sounds: Normal breath sounds.  Musculoskeletal:     Cervical back: Normal range of motion and neck supple.  Neurological:     General: No focal deficit present.     Mental Status: She is alert and oriented to person, place, and time.  Psychiatric:        Mood and Affect: Mood normal.        Behavior: Behavior normal.     ASSESSMENT AND PLAN: 1. Primary hypertension - Continue Losartan as prescribed.  - Counseled on blood pressure goal of less than 130/80, low-sodium, DASH diet, medication compliance, and 150 minutes of moderate intensity exercise per week as tolerated. Counseled on medication adherence and adverse effects. - Follow-up with primary provider in 3 months or sooner if needed.  - losartan (COZAAR) 50 MG tablet; Take 1 tablet (50 mg total) by mouth daily.  Dispense: 90  tablet; Refill: 0  2. Hyperlipidemia, unspecified hyperlipidemia type - Continue Atorvastatin as prescribed. - Routine screening.  - Follow-up with primary provider as scheduled. - Lipid panel - atorvastatin (LIPITOR) 20 MG tablet; Take 1 tablet (20 mg total) by mouth daily.  Dispense: 90 tablet; Refill: 0   Patient was given the opportunity to ask questions.  Patient verbalized understanding of the plan and was able to repeat key elements of the plan. Patient was given  clear instructions to go to Emergency Department or return to medical center if symptoms don't improve, worsen, or new problems develop.The patient verbalized understanding.   Orders Placed This Encounter  Procedures   Lipid panel     Requested Prescriptions   Signed Prescriptions Disp Refills   losartan (COZAAR) 50 MG tablet 90 tablet 0    Sig: Take 1 tablet (50 mg total) by mouth daily.   atorvastatin (LIPITOR) 20 MG tablet 90 tablet 0    Sig: Take 1 tablet (20 mg total) by mouth daily.    Return in about 3 months (around 11/21/2022) for Follow-Up or next available chronic care mgmt .  Rema Fendt, NP

## 2022-08-22 LAB — LIPID PANEL
Chol/HDL Ratio: 3.7 ratio (ref 0.0–4.4)
Cholesterol, Total: 173 mg/dL (ref 100–199)
HDL: 47 mg/dL (ref >39)
LDL Chol Calc (NIH): 107 mg/dL — ABNORMAL HIGH (ref 0–99)
Triglycerides: 107 mg/dL (ref 0–149)
VLDL Cholesterol Cal: 19 mg/dL (ref 5–40)

## 2022-11-01 ENCOUNTER — Other Ambulatory Visit: Payer: Self-pay

## 2022-11-01 DIAGNOSIS — I10 Essential (primary) hypertension: Secondary | ICD-10-CM

## 2022-11-01 MED ORDER — LOSARTAN POTASSIUM 50 MG PO TABS
50.0000 mg | ORAL_TABLET | Freq: Every day | ORAL | 0 refills | Status: DC
Start: 2022-11-01 — End: 2022-11-21

## 2022-11-21 ENCOUNTER — Ambulatory Visit (INDEPENDENT_AMBULATORY_CARE_PROVIDER_SITE_OTHER): Payer: 59 | Admitting: Family

## 2022-11-21 ENCOUNTER — Encounter: Payer: Self-pay | Admitting: Family

## 2022-11-21 VITALS — BP 130/92 | HR 85 | Temp 98.9°F | Ht 61.0 in | Wt 166.8 lb

## 2022-11-21 DIAGNOSIS — E785 Hyperlipidemia, unspecified: Secondary | ICD-10-CM | POA: Diagnosis not present

## 2022-11-21 DIAGNOSIS — I10 Essential (primary) hypertension: Secondary | ICD-10-CM | POA: Diagnosis not present

## 2022-11-21 DIAGNOSIS — R21 Rash and other nonspecific skin eruption: Secondary | ICD-10-CM | POA: Diagnosis not present

## 2022-11-21 DIAGNOSIS — R7303 Prediabetes: Secondary | ICD-10-CM

## 2022-11-21 MED ORDER — TRIAMCINOLONE ACETONIDE 0.5 % EX OINT: 1.0000 | TOPICAL_OINTMENT | Freq: Two times a day (BID) | CUTANEOUS | 1 refills | Status: DC

## 2022-11-21 MED ORDER — LOSARTAN POTASSIUM 50 MG PO TABS
50.0000 mg | ORAL_TABLET | Freq: Every day | ORAL | 0 refills | Status: DC
Start: 2022-11-21 — End: 2023-03-01

## 2022-11-21 NOTE — Progress Notes (Signed)
Patient states rash on her neck that wont go away.  Patient declined Flu vac.

## 2022-11-21 NOTE — Progress Notes (Signed)
Patient ID: [name], female    DOB: 1981/01/12  MRN: [id number]  CC: Chronic Conditions Follow-Up  Subjective: [name] is a 42 y.o. female who presents for chronic conditions follow-up.  Her concerns today include:  - Doing well on Losartan, no issues/concerns. She does not complain of red flag symptoms such as but not limited to chest pain, shortness of breath, worst headache of life, nausea/vomiting.  - Reports she quit taking Atorvastatin because was causing constipation.  - Reports itching rash of neck. Trying over-the-counter cream with minimal relief.   Patient Active Problem List   Diagnosis Date Noted   Hyperlipidemia 03/27/2022   Prediabetes 03/27/2022   Acute pansinusitis 11/15/2020   Hidradenitis 11/15/2020   Essential hypertension 03/03/2020   Obesity (BMI 30.0-34.9) 03/03/2020     Current Outpatient Medications on File Prior to Visit  Medication Sig Dispense Refill   acetaminophen (TYLENOL) 500 MG tablet Take 1,000 mg by mouth daily as needed for headache.     atorvastatin (LIPITOR) 20 MG tablet Take 1 tablet (20 mg total) by mouth daily. (Patient not taking: Reported on 11/21/2022) 90 tablet 0   No current facility-administered medications on file prior to visit.    No Known Allergies  Social History   Socioeconomic History   Marital status: Married    Spouse name: Not on file   Number of children: Not on file   Years of education: Not on file   Highest education level: Some college, no degree  Occupational History   Not on file  Tobacco Use   Smoking status: Former    Current packs/day: 0.00    Average packs/day: 0.5 packs/day for 15.0 years (7.5 ttl pk-yrs)    Types: Cigarettes    Start date: 07/06/2005    Quit date: 07/06/2020    Years since quitting: 2.3    Passive exposure: Past   Smokeless tobacco: Never  Substance and Sexual Activity   Alcohol use: Yes    Comment: occasional wine 2/month   Drug use: No   Sexual activity: Yes     Birth control/protection: Implant    Comment: implanon  Other Topics Concern   Not on file  Social History Narrative   Not on file   Social Determinants of Health   Financial Resource Strain: Low Risk  (08/21/2022)   Overall Financial Resource Strain (CARDIA)    Difficulty of Paying Living Expenses: Not hard at all  Food Insecurity: No Food Insecurity (08/21/2022)   Hunger Vital Sign    Worried About Running Out of Food in the Last Year: Never true    Ran Out of Food in the Last Year: Never true  Transportation Needs: No Transportation Needs (08/21/2022)   PRAPARE - Administrator, Civil Service (Medical): No    Lack of Transportation (Non-Medical): No  Physical Activity: Insufficiently Active (08/21/2022)   Exercise Vital Sign    Days of Exercise per Week: 3 days    Minutes of Exercise per Session: 40 min  Stress: No Stress Concern Present (08/21/2022)   [employer] of Occupational Health - Occupational Stress Questionnaire    Feeling of Stress : Not at all  Social Connections: Moderately Integrated (08/21/2022)   Social Connection and Isolation Panel [NHANES]    Frequency of Communication with Friends and Family: More than three times a week    Frequency of Social Gatherings with Friends and Family: Twice a week    Attends Religious Services: 1 to 4  times per year    Active Member of Clubs or Organizations: No    Attends Banker Meetings: Not on file    Marital Status: Married  Intimate Partner Violence: Unknown (05/12/2021)   Received from [facility]   HITS    Physically Hurt: Not on file    Insult or Talk Down To: Not on file    Threaten Physical Harm: Not on file    Scream or Curse: Not on file    Family History  Problem Relation Age of Onset   Hypertension Mother    Heart failure Father 52   Diabetes Maternal Grandmother     Past Surgical History:  Procedure Laterality Date   BACK SURGERY     rods in lower back    DILATATION & CURETTAGE/HYSTEROSCOPY WITH MYOSURE N/A 10/27/2015   Procedure: DILATATION & CURETTAGE/HYSTEROSCOPY WITH MYOSURE WITH RESECTION OF FIBROID;  Surgeon: Maxie Better, MD;  Location: [facility];  Service: Gynecology;  Laterality: N/A;  Requests 1 hr.   WISDOM TOOTH EXTRACTION      ROS: Review of Systems Negative except as stated above  PHYSICAL EXAM: BP (!) 130/92   Pulse 85   Temp 98.9 F (37.2 C) (Oral)   Ht 5\' 1"  (1.549 m)   Wt 166 lb 12.8 oz (75.7 kg)   LMP 11/07/2022 (Exact Date)   SpO2 97%   BMI 31.52 kg/m   Physical Exam HENT:     Head: Normocephalic and atraumatic.     Nose: Nose normal.     Mouth/Throat:     Mouth: Mucous membranes are moist.     Pharynx: Oropharynx is clear.  Eyes:     Extraocular Movements: Extraocular movements intact.     Conjunctiva/sclera: Conjunctivae normal.     Pupils: Pupils are equal, round, and reactive to light.  Cardiovascular:     Rate and Rhythm: Normal rate and regular rhythm.     Pulses: Normal pulses.     Heart sounds: Normal heart sounds.  Pulmonary:     Effort: Pulmonary effort is normal.     Breath sounds: Normal breath sounds.  Musculoskeletal:        General: Normal range of motion.     Cervical back: Normal range of motion and neck supple.  Skin:    Findings: Rash present.     Comments: Hyperpigmented macular rash of posterior neck, no drainage.  Neurological:     General: No focal deficit present.     Mental Status: She is alert and oriented to person, place, and time.  Psychiatric:        Mood and Affect: Mood normal.        Behavior: Behavior normal.    ASSESSMENT AND PLAN: 1. Primary hypertension - Blood pressure relative to goal.  - Continue Losartan as prescribed. - Routine screening.  - Counseled on blood pressure goal of less than 130/80, low-sodium, DASH diet, medication compliance, and 150 minutes of moderate intensity exercise per week as tolerated. Counseled on medication adherence and  adverse effects. - Follow-up with primary provider in 3 months or sooner if needed.  - Basic Metabolic Panel - losartan (COZAAR) 50 MG tablet; Take 1 tablet (50 mg total) by mouth daily.  Dispense: 90 tablet; Refill: 0  2. Hyperlipidemia, unspecified hyperlipidemia type - Patient reports she is no longer taking Atorvastatin. - Routine screening.  - Follow-up with primary provider as scheduled. - Lipid panel  3. Prediabetes - Routine screening.  - Hemoglobin  A1c  4. Rash and nonspecific skin eruption - Triamcinolone ointment as prescribed. Counseled on medication adherence/adverse effects.  - Follow-up with primary provider in 4 weeks or sooner if needed.  - triamcinolone ointment (KENALOG) 0.5 %; Apply 1 Application topically 2 (two) times daily.  Dispense: 60 g; Refill: 1     Patient was given the opportunity to ask questions.  Patient verbalized understanding of the plan and was able to repeat key elements of the plan. Patient was given clear instructions to go to Emergency Department or return to medical center if symptoms don't improve, worsen, or new problems develop.The patient verbalized understanding.   Orders Placed This Encounter  Procedures   Basic Metabolic Panel   Lipid panel   Hemoglobin A1c     Requested Prescriptions   Signed Prescriptions Disp Refills   losartan (COZAAR) 50 MG tablet 90 tablet 0    Sig: Take 1 tablet (50 mg total) by mouth daily.   triamcinolone ointment (KENALOG) 0.5 % 60 g 1    Sig: Apply 1 Application topically 2 (two) times daily.    Return in about 3 months (around 02/21/2023) for Follow-Up or next available chronic conditions.  Rema Fendt, NP

## 2022-11-29 ENCOUNTER — Other Ambulatory Visit: Payer: 59

## 2022-11-29 NOTE — Progress Notes (Signed)
Patient is in office today for a nurse visit/labs for  labs . Patient

## 2022-11-30 LAB — BASIC METABOLIC PANEL
BUN/Creatinine Ratio: 13 (ref 9–23)
BUN: 9 mg/dL (ref 6–24)
CO2: 20 mmol/L (ref 20–29)
Calcium: 8.9 mg/dL (ref 8.7–10.2)
Chloride: 106 mmol/L (ref 96–106)
Creatinine, Ser: 0.69 mg/dL (ref 0.57–1.00)
Glucose: 81 mg/dL (ref 70–99)
Potassium: 4 mmol/L (ref 3.5–5.2)
Sodium: 138 mmol/L (ref 134–144)
eGFR: 112 mL/min/{1.73_m2} (ref >59)

## 2022-11-30 LAB — LIPID PANEL
Chol/HDL Ratio: 4.5 {ratio} — ABNORMAL HIGH (ref 0.0–4.4)
Cholesterol, Total: 193 mg/dL (ref 100–199)
HDL: 43 mg/dL (ref >39)
LDL Chol Calc (NIH): 129 mg/dL — ABNORMAL HIGH (ref 0–99)
Triglycerides: 114 mg/dL (ref 0–149)
VLDL Cholesterol Cal: 21 mg/dL (ref 5–40)

## 2022-11-30 LAB — HEMOGLOBIN A1C
Est. average glucose Bld gHb Est-mCnc: 123 mg/dL
Hgb A1c MFr Bld: 5.9 % — ABNORMAL HIGH (ref 4.8–5.6)

## 2022-12-04 ENCOUNTER — Other Ambulatory Visit: Payer: Self-pay | Admitting: Family

## 2022-12-04 DIAGNOSIS — E785 Hyperlipidemia, unspecified: Secondary | ICD-10-CM

## 2022-12-04 MED ORDER — ATORVASTATIN CALCIUM 20 MG PO TABS
20.0000 mg | ORAL_TABLET | Freq: Every day | ORAL | 0 refills | Status: DC
Start: 2022-12-04 — End: 2023-08-23

## 2023-02-26 ENCOUNTER — Telehealth: Payer: Self-pay | Admitting: Family

## 2023-03-01 ENCOUNTER — Other Ambulatory Visit: Payer: Self-pay | Admitting: Family

## 2023-03-01 DIAGNOSIS — I10 Essential (primary) hypertension: Secondary | ICD-10-CM

## 2023-03-01 NOTE — Telephone Encounter (Signed)
Last Fill: 11/21/22  Last OV: 11/21/22 Next OV: None Scheduled  Routing to provider for review/authorization.

## 2023-03-01 NOTE — Telephone Encounter (Signed)
Copied from CRM [id number]. Topic: Clinical - Medication Refill >> Mar 01, 2023  3:11 PM Dimitri Ped wrote: Most Recent Primary Care Visit:  Provider: [facility]  Department[facility]  Visit Type: LAB  Date: 11/29/2022  Medication: losartan (COZAAR) 50 MG tablet   Has the patient contacted their pharmacy? Yes pharmacy has tried to reach doctor no response (Agent: If no, request that the patient contact the pharmacy for the refill. If patient does not wish to contact the pharmacy document the reason why and proceed with request.) (Agent: If yes, when and what did the pharmacy advise?)  Is this the correct pharmacy for this prescription? Yes If no, delete pharmacy and type the correct one.  This is the patient's preferred pharmacy:  [facility] - [place], [place] - [address] [place] [address] Phone: [phone] Fax: [phone]   Has the prescription been filled recently? No  Is the patient out of the medication? No  Has the patient been seen for an appointment in the last year OR does the patient have an upcoming appointment? Yes oct of last year   Can we respond through MyChart? Yes  Agent: Please be advised that Rx refills may take up to 3 business days. We ask that you follow-up with your pharmacy.

## 2023-03-04 ENCOUNTER — Other Ambulatory Visit: Payer: Self-pay

## 2023-03-04 DIAGNOSIS — I10 Essential (primary) hypertension: Secondary | ICD-10-CM

## 2023-03-04 MED ORDER — LOSARTAN POTASSIUM 50 MG PO TABS: 50.0000 mg | ORAL_TABLET | Freq: Every day | ORAL | 0 refills | Status: DC

## 2023-03-28 ENCOUNTER — Telehealth: Payer: Self-pay | Admitting: Family

## 2023-03-28 NOTE — Telephone Encounter (Signed)
 [name]

## 2023-05-10 ENCOUNTER — Other Ambulatory Visit: Payer: Self-pay | Admitting: Family

## 2023-05-10 DIAGNOSIS — I10 Essential (primary) hypertension: Secondary | ICD-10-CM

## 2023-05-15 ENCOUNTER — Other Ambulatory Visit: Payer: Self-pay | Admitting: Obstetrics and Gynecology

## 2023-06-18 ENCOUNTER — Other Ambulatory Visit: Payer: Self-pay | Admitting: Family

## 2023-06-18 DIAGNOSIS — I10 Essential (primary) hypertension: Secondary | ICD-10-CM

## 2023-06-18 NOTE — Telephone Encounter (Unsigned)
 Copied from CRM [id number]. Topic: Clinical - Medication Refill >> Jun 18, 2023 11:11 AM Emylou G wrote: Medication: losartan  (COZAAR ) 50 MG tablet  Has the patient contacted their pharmacy? Yes (Agent: If no, request that the patient contact the pharmacy for the refill. If patient does not wish to contact the pharmacy document the reason why and proceed with request.) (Agent: If yes, when and what did the pharmacy advise?) said to call us   This is the patient's preferred pharmacy:  [facility] - [place], [place] - [address] [place] [address] Phone: [phone] Fax: [phone]  Is this the correct pharmacy for this prescription? Yes If no, delete pharmacy and type the correct one.   Has the prescription been filled recently? No  Is the patient out of the medication? Yes 10 left  Has the patient been seen for an appointment in the last year OR does the patient have an upcoming appointment? Yes  Can we respond through MyChart? Yes  Agent: Please be advised that Rx refills may take up to 3 business days. We ask that you follow-up with your pharmacy.

## 2023-06-20 MED ORDER — LOSARTAN POTASSIUM 50 MG PO TABS
50.0000 mg | ORAL_TABLET | Freq: Every day | ORAL | 0 refills | Status: DC
Start: 2023-06-20 — End: 2023-08-05

## 2023-06-20 NOTE — Telephone Encounter (Signed)
 Requested Prescriptions  Pending Prescriptions Disp Refills   losartan  (COZAAR ) 50 MG tablet 90 tablet 0    Sig: Take 1 tablet (50 mg total) by mouth daily.     Cardiovascular:  Angiotensin Receptor Blockers Failed - 06/20/2023  9:38 AM      Failed - Cr in normal range and within 180 days    Creatinine, Ser  Date Value Ref Range Status  11/29/2022 0.69 0.57 - 1.00 mg/dL Final         Failed - K in normal range and within 180 days    Potassium  Date Value Ref Range Status  11/29/2022 4.0 3.5 - 5.2 mmol/L Final         Failed - Last BP in normal range    BP Readings from Last 1 Encounters:  11/21/22 (!) 130/92         Failed - Valid encounter within last 6 months    Recent Outpatient Visits           7 months ago Primary hypertension   [facility], Amy J, NP   10 months ago Hyperlipidemia, unspecified hyperlipidemia type   [facility], [place], NP   11 months ago Annual physical exam   [place]  [facility] at [facility], [place], NP   1 year ago Primary hypertension   [facility], [place], NP   1 year ago Essential (primary) hypertension    [facility] Care at [facility], Annalee Barren, NP              Passed - Patient is not pregnant

## 2023-07-04 ENCOUNTER — Ambulatory Visit (INDEPENDENT_AMBULATORY_CARE_PROVIDER_SITE_OTHER): Payer: Self-pay | Admitting: Primary Care

## 2023-08-05 ENCOUNTER — Ambulatory Visit (INDEPENDENT_AMBULATORY_CARE_PROVIDER_SITE_OTHER): Payer: Self-pay | Admitting: Family

## 2023-08-05 ENCOUNTER — Encounter: Payer: Self-pay | Admitting: Family

## 2023-08-05 VITALS — BP 137/94 | HR 98 | Temp 99.2°F | Ht 62.0 in | Wt 167.4 lb

## 2023-08-05 DIAGNOSIS — I10 Essential (primary) hypertension: Secondary | ICD-10-CM

## 2023-08-05 DIAGNOSIS — E785 Hyperlipidemia, unspecified: Secondary | ICD-10-CM | POA: Diagnosis not present

## 2023-08-05 DIAGNOSIS — R7303 Prediabetes: Secondary | ICD-10-CM | POA: Diagnosis not present

## 2023-08-05 DIAGNOSIS — N951 Menopausal and female climacteric states: Secondary | ICD-10-CM | POA: Diagnosis not present

## 2023-08-05 MED ORDER — LOSARTAN POTASSIUM 100 MG PO TABS: 100.0000 mg | ORAL_TABLET | Freq: Every day | ORAL | 0 refills | Status: DC

## 2023-08-05 NOTE — Progress Notes (Signed)
 Follow up for b/p medication

## 2023-08-05 NOTE — Progress Notes (Signed)
 Patient ID: [name], female    DOB: 1980-06-30  MRN: [id number]  CC: Chronic Conditions Follow-Up  Subjective: [name] is a 43 y.o. female who presents for chronic conditions follow-up.  Her concerns today include:  - Doing well on Losartan , no issues/concerns. She does not complain of red flag symptoms such as but not limited to chest pain, shortness of breath, worst headache of life, nausea/vomiting.  - States she has not taken Atorvastatin  in a while. - Prediabetes follow-up.  - States she is scheduled for upcoming hysterectomy and thinks she in perimenopause.  Patient Active Problem List   Diagnosis Date Noted   Hyperlipidemia 03/27/2022   Prediabetes 03/27/2022   Acute pansinusitis 11/15/2020   Hidradenitis 11/15/2020   Essential hypertension 03/03/2020   Obesity (BMI 30.0-34.9) 03/03/2020     Current Outpatient Medications on File Prior to Visit  Medication Sig Dispense Refill   acetaminophen (TYLENOL) 500 MG tablet Take 1,000 mg by mouth daily as needed for headache.     atorvastatin  (LIPITOR) 20 MG tablet Take 1 tablet (20 mg total) by mouth daily. (Patient not taking: Reported on 11/21/2022) 90 tablet 0   atorvastatin  (LIPITOR) 20 MG tablet Take 1 tablet (20 mg total) by mouth daily. (Patient not taking: Reported on 08/05/2023) 90 tablet 0   triamcinolone  ointment (KENALOG ) 0.5 % Apply 1 Application topically 2 (two) times daily. (Patient not taking: Reported on 08/05/2023) 60 g 1   No current facility-administered medications on file prior to visit.    No Known Allergies  Social History   Socioeconomic History   Marital status: Married    Spouse name: Not on file   Number of children: Not on file   Years of education: Not on file   Highest education level: Some college, no degree  Occupational History   Not on file  Tobacco Use   Smoking status: Former    Current packs/day: 0.00    Average packs/day: 0.5 packs/day for 15.0 years (7.5 ttl  pk-yrs)    Types: Cigarettes    Start date: 07/06/2005    Quit date: 07/06/2020    Years since quitting: 3.0    Passive exposure: Past   Smokeless tobacco: Never  Substance and Sexual Activity   Alcohol use: Yes    Comment: occasional wine 2/month   Drug use: No   Sexual activity: Yes    Birth control/protection: Implant    Comment: implanon  Other Topics Concern   Not on file  Social History Narrative   Not on file   Social Drivers of Health   Financial Resource Strain: Low Risk  (08/21/2022)   Overall Financial Resource Strain (CARDIA)    Difficulty of Paying Living Expenses: Not hard at all  Food Insecurity: No Food Insecurity (08/21/2022)   Hunger Vital Sign    Worried About Running Out of Food in the Last Year: Never true    Ran Out of Food in the Last Year: Never true  Transportation Needs: No Transportation Needs (08/21/2022)   PRAPARE - Administrator, Civil Service (Medical): No    Lack of Transportation (Non-Medical): No  Physical Activity: Insufficiently Active (08/21/2022)   Exercise Vital Sign    Days of Exercise per Week: 3 days    Minutes of Exercise per Session: 40 min  Stress: No Stress Concern Present (08/21/2022)   [employer] of Occupational Health - Occupational Stress Questionnaire    Feeling of Stress : Not at all  Social Connections: Moderately Integrated (08/21/2022)   Social Connection and Isolation Panel    Frequency of Communication with Friends and Family: More than three times a week    Frequency of Social Gatherings with Friends and Family: Twice a week    Attends Religious Services: 1 to 4 times per year    Active Member of [employer] or Organizations: No    Attends Engineer, structural: Not on file    Marital Status: Married  Intimate Partner Violence: Unknown (05/12/2021)   Received from [facility]   HITS    Physically Hurt: Not on file    Insult or Talk Down To: Not on file    Threaten Physical Harm: Not on file     Scream or Curse: Not on file    Family History  Problem Relation Age of Onset   Hypertension Mother    Heart failure Father 17   Diabetes Maternal Grandmother     Past Surgical History:  Procedure Laterality Date   BACK SURGERY     rods in lower back   DILATATION & CURETTAGE/HYSTEROSCOPY WITH MYOSURE N/A 10/27/2015   Procedure: DILATATION & CURETTAGE/HYSTEROSCOPY WITH MYOSURE WITH RESECTION OF FIBROID;  Surgeon: Dickie Carder, MD;  Location: [facility];  Service: Gynecology;  Laterality: N/A;  Requests 1 hr.   WISDOM TOOTH EXTRACTION      ROS: Review of Systems Negative except as stated above  PHYSICAL EXAM: BP (!) 137/94   Pulse 98   Temp 99.2 F (37.3 C) (Oral)   Ht 5' 2 (1.575 m)   Wt 167 lb 6.4 oz (75.9 kg)   LMP 07/29/2023 (Exact Date)   SpO2 97%   BMI 30.62 kg/m   Physical Exam HENT:     Head: Normocephalic and atraumatic.     Nose: Nose normal.     Mouth/Throat:     Mouth: Mucous membranes are moist.     Pharynx: Oropharynx is clear.   Eyes:     Extraocular Movements: Extraocular movements intact.     Conjunctiva/sclera: Conjunctivae normal.     Pupils: Pupils are equal, round, and reactive to light.    Cardiovascular:     Rate and Rhythm: Normal rate and regular rhythm.     Pulses: Normal pulses.     Heart sounds: Normal heart sounds.  Pulmonary:     Effort: Pulmonary effort is normal.     Breath sounds: Normal breath sounds.   Musculoskeletal:        General: Normal range of motion.     Cervical back: Normal range of motion and neck supple.   Neurological:     General: No focal deficit present.     Mental Status: She is alert and oriented to person, place, and time.   Psychiatric:        Mood and Affect: Mood normal.        Behavior: Behavior normal.     ASSESSMENT AND PLAN: 1. Primary hypertension (Primary) - Blood pressure not at goal during today's visit. Patient asymptomatic without chest pressure, chest pain, palpitations,  shortness of breath, worst headache of life, and any additional red flag symptoms. - Increase Losartan  from 50 mg to 100 mg as prescribed.  - Routine screening.  - Counseled on blood pressure goal of less than 130/80, low-sodium, DASH diet, medication compliance, and 150 minutes of moderate intensity exercise per week as tolerated. Counseled on medication adherence and adverse effects. - Follow-up with primary provider in 4 weeks or sooner  if needed.  - losartan  (COZAAR ) 100 MG tablet; Take 1 tablet (100 mg total) by mouth daily.  Dispense: 90 tablet; Refill: 0 - Basic Metabolic Panel  2. Hyperlipidemia, unspecified hyperlipidemia type - Routine screening.  - Follow-up with primary provider as scheduled. - Lipid panel  3. Prediabetes - Routine screening.  - Hemoglobin A1c  4. Perimenopausal vasomotor symptoms - Routine screening.  - Follicle stimulating hormone - Luteinizing hormone   Patient was given the opportunity to ask questions.  Patient verbalized understanding of the plan and was able to repeat key elements of the plan. Patient was given clear instructions to go to Emergency Department or return to medical center if symptoms don't improve, worsen, or new problems develop.The patient verbalized understanding.   Orders Placed This Encounter  Procedures   Basic Metabolic Panel   Lipid panel   Follicle stimulating hormone   Luteinizing hormone   Hemoglobin A1c     Requested Prescriptions   Signed Prescriptions Disp Refills   losartan  (COZAAR ) 100 MG tablet 90 tablet 0    Sig: Take 1 tablet (100 mg total) by mouth daily.    Return in about 4 weeks (around 09/02/2023) for Follow-Up or next available chronic conditions.  Greig JINNY Drones, NP

## 2023-08-06 ENCOUNTER — Ambulatory Visit: Payer: Self-pay | Admitting: Family

## 2023-08-06 DIAGNOSIS — E785 Hyperlipidemia, unspecified: Secondary | ICD-10-CM

## 2023-08-06 LAB — LIPID PANEL
Chol/HDL Ratio: 4.1 ratio (ref 0.0–4.4)
Cholesterol, Total: 187 mg/dL (ref 100–199)
HDL: 46 mg/dL (ref >39)
LDL Chol Calc (NIH): 112 mg/dL — ABNORMAL HIGH (ref 0–99)
Triglycerides: 168 mg/dL — ABNORMAL HIGH (ref 0–149)
VLDL Cholesterol Cal: 29 mg/dL (ref 5–40)

## 2023-08-06 LAB — BASIC METABOLIC PANEL WITH GFR
BUN/Creatinine Ratio: 12 (ref 9–23)
BUN: 9 mg/dL (ref 6–24)
CO2: 18 mmol/L — ABNORMAL LOW (ref 20–29)
Calcium: 9.3 mg/dL (ref 8.7–10.2)
Chloride: 107 mmol/L — ABNORMAL HIGH (ref 96–106)
Creatinine, Ser: 0.75 mg/dL (ref 0.57–1.00)
Glucose: 85 mg/dL (ref 70–99)
Potassium: 3.9 mmol/L (ref 3.5–5.2)
Sodium: 141 mmol/L (ref 134–144)
eGFR: 103 mL/min/{1.73_m2} (ref >59)

## 2023-08-06 LAB — HEMOGLOBIN A1C
Est. average glucose Bld gHb Est-mCnc: 123 mg/dL
Hgb A1c MFr Bld: 5.9 % — ABNORMAL HIGH (ref 4.8–5.6)

## 2023-08-06 LAB — LUTEINIZING HORMONE: LH: 10.7 m[IU]/mL

## 2023-08-06 LAB — FOLLICLE STIMULATING HORMONE: FSH: 10.1 m[IU]/mL

## 2023-08-06 MED ORDER — ATORVASTATIN CALCIUM 20 MG PO TABS: 20.0000 mg | ORAL_TABLET | Freq: Every day | ORAL | 0 refills | Status: DC

## 2023-08-23 ENCOUNTER — Encounter (HOSPITAL_COMMUNITY): Payer: Self-pay | Admitting: Obstetrics and Gynecology

## 2023-08-23 NOTE — Progress Notes (Signed)
 Spoke w/ via phone for pre-op interview--- pt Lab needs dos---- upt        Lab results------ lab appt 08-08-2023 2 1300 getting CBC/ BMP/ T&S/ EKG COVID test -----patient states asymptomatic no test needed Arrive at ------- 0530 on 08-30-2023 NPO after MN w/ exception sips of water w/ meds Pre-Surgery Ensure or G2: n/a  Med rec completed Medications to take morning of surgery ----- none Diabetic medication ----- n/a  GLP1 agonist last dose: n/a GLP1 instructions:  Patient instructed no nail polish to be worn day of surgery Patient instructed to bring photo id and insurance card day of surgery Patient aware to have Driver (ride ) / caregiver    for 24 hours after surgery - mother, myra caregiver Patient Special Instructions ----- will pick up bag w/ soap and written instructions at lab appt Pre-Op special Instructions ----- n/a  Patient verbalized understanding of instructions that were given at this phone interview. Patient denies chest pain, sob, fever, cough at the interview.

## 2023-08-23 NOTE — Pre-Procedure Instructions (Signed)
 Surgical Instructions   Your procedure is scheduled on :  Friday,  08-30-2023. Report to [facility] Main Entrance A at 5:30  A.M., then check in with the Admitting office. Any questions or running late day of surgery: call [phone]  Questions prior to your surgery date: call [phone] Monday-Friday, 8am-4pm. If you experience any cold or flu symptoms such as cough, fever, chills, shortness of breath, etc. between now and your scheduled surgery, please notify your surgeon office.     Remember:  Do not eat any food and do not drink any liquids after midnight the night before your surgery.  This includes no water,  candy,  gum,  and  mints.   Take these medicines the morning of surgery with A SIP OF WATER :  none   May take these medicines IF NEEDED: none    One week prior to surgery, STOP taking any Aspirin (unless otherwise instructed by your surgeon) Aleve , Naproxen , Ibuprofen , Motrin , Advil , Goody's, BC's, all herbal medications, fish oil, and non-prescription vitamins.                     Do NOT Smoke (Tobacco/Vaping) and Do Not drink alcohol for 24 hours prior to your procedure.  If you use a CPAP at night, you may bring your mask/headgear for your overnight stay.   You will be asked to remove any contacts, glasses, piercing's, hearing aid's, dentures/partials prior to surgery. Please bring cases/ contact case & solution for these items if needed.    Patients discharged the day of surgery will not be allowed to drive home, and someone needs to stay with them for 24 hours.  SURGICAL WAITING ROOM VISITATION Patients may have no more than 2 support people in the waiting area - these visitors may rotate.   Pre-op nurse will coordinate an appropriate time for 1 ADULT support person, who may not rotate, to accompany patient in pre-op.  Children under the age of 3 must have an adult with them who is not the patient and must remain in the main waiting area with an adult.  If  the patient needs to stay at the hospital during part of their recovery, the visitor guidelines for inpatient rooms apply.  Please refer to the [facility] website for the visitor guidelines for any additional information.   If you received a COVID test during your pre-op visit  it is requested that you wear a mask when out in public, stay away from anyone that may not be feeling well and notify your surgeon if you develop symptoms. If you have been in contact with anyone that has tested positive in the last 10 days please notify you surgeon.      Pre-operative CHG Bathing Instructions   You can play a key role in reducing the risk of infection after surgery. Your skin needs to be as free of germs as possible. You can reduce the number of germs on your skin by washing with CHG (chlorhexidine  gluconate) soap before surgery. CHG is an antiseptic soap that kills germs and continues to kill germs even after washing.   DO NOT use if you have an allergy to chlorhexidine /CHG or antibacterial soaps. If your skin becomes reddened or irritated, stop using the CHG and notify Pre-op nurse day of surgery.             TAKE A SHOWER THE NIGHT BEFORE SURGERY AND THE DAY OF SURGERY    Please keep in mind the  following:  DO NOT shave, including legs and underarms, 48 hours prior to surgery.   You may shave your face before/day of surgery.  Place clean sheets on your bed the night before surgery Use a clean washcloth (not used since being washed) for each shower. DO NOT sleep with pet's night before surgery.  CHG Shower Instructions:  Wash your face and private area with normal soap. If you choose to wash your hair, wash first with your normal shampoo.  After you use shampoo/soap, rinse your hair and body thoroughly to remove shampoo/soap residue.  Turn the water OFF and apply half the bottle of CHG soap to a CLEAN washcloth.  Apply CHG soap ONLY FROM YOUR NECK DOWN TO YOUR TOES (washing for 3-5 minutes)   DO NOT use CHG soap on face, private areas, open wounds, or sores.  Pay special attention to the area where your surgery is being performed.  If you are having back surgery, having someone wash your back for you may be helpful. Wait 2 minutes after CHG soap is applied, then you may rinse off the CHG soap.  Pat dry with a clean towel  Put on clean pajamas    Additional instructions for the day of surgery: DO NOT APPLY any lotions,  powder,  oils,  deodorants (may use underarm deodorant) , cologne /  perfumes or makeup Do not wear jewelry / piercing's/ metal/  permanent jewelry must be removed prior to arrival day of surgery.  (No plastic piercing) Do not wear nail polish, gel polish, artificial nails, or any other type of covering on natural finger nails (toe nails are okay) Do not bring valuables to the hospital. [facility] is not responsible for valuables/personal belongings. Put on clean/comfortable clothes.  Please brush your teeth.  Ask your nurse before applying any prescription medications to the skin.

## 2023-08-28 ENCOUNTER — Encounter: Payer: Self-pay | Admitting: Family

## 2023-08-28 ENCOUNTER — Encounter (HOSPITAL_COMMUNITY)
Admission: RE | Admit: 2023-08-28 | Discharge: 2023-08-28 | Disposition: A | Discharge status: Home / Self Care | Source: Ambulatory Visit | Attending: Obstetrics and Gynecology | Admitting: Obstetrics and Gynecology

## 2023-08-28 ENCOUNTER — Ambulatory Visit (INDEPENDENT_AMBULATORY_CARE_PROVIDER_SITE_OTHER): Admitting: Family

## 2023-08-28 VITALS — BP 135/94 | HR 90 | Temp 99.2°F | Resp 16 | Ht 62.0 in | Wt 167.2 lb

## 2023-08-28 DIAGNOSIS — Z01818 Encounter for other preprocedural examination: Secondary | ICD-10-CM | POA: Insufficient documentation

## 2023-08-28 DIAGNOSIS — E785 Hyperlipidemia, unspecified: Secondary | ICD-10-CM | POA: Diagnosis not present

## 2023-08-28 DIAGNOSIS — I10 Essential (primary) hypertension: Secondary | ICD-10-CM | POA: Diagnosis not present

## 2023-08-28 LAB — CBC
HCT: 41.8 % (ref 36.0–46.0)
Hemoglobin: 13.6 g/dL (ref 12.0–15.0)
MCH: 27.9 pg (ref 26.0–34.0)
MCHC: 32.5 g/dL (ref 30.0–36.0)
MCV: 85.8 fL (ref 80.0–100.0)
Platelets: 358 K/uL (ref 150–400)
RBC: 4.87 MIL/uL (ref 3.87–5.11)
RDW: 13.3 % (ref 11.5–15.5)
WBC: 8.3 K/uL (ref 4.0–10.5)
nRBC: 0 % (ref 0.0–0.2)

## 2023-08-28 LAB — TYPE AND SCREEN
ABO/RH(D): O POS
Antibody Screen: NEGATIVE

## 2023-08-28 LAB — BASIC METABOLIC PANEL WITH GFR
Anion gap: 9 (ref 5–15)
BUN: 7 mg/dL (ref 6–20)
CO2: 25 mmol/L (ref 22–32)
Calcium: 9.3 mg/dL (ref 8.9–10.3)
Chloride: 102 mmol/L (ref 98–111)
Creatinine, Ser: 0.72 mg/dL (ref 0.44–1.00)
GFR, Estimated: >60 mL/min (ref >60)
Glucose, Bld: 139 mg/dL — ABNORMAL HIGH (ref 70–99)
Potassium: 3.6 mmol/L (ref 3.5–5.1)
Sodium: 136 mmol/L (ref 135–145)

## 2023-08-28 MED ORDER — ROSUVASTATIN CALCIUM 5 MG PO TABS: 5.0000 mg | ORAL_TABLET | Freq: Every day | ORAL | 0 refills | Status: AC

## 2023-08-28 MED ORDER — AMLODIPINE BESYLATE 5 MG PO TABS: 5.0000 mg | ORAL_TABLET | Freq: Every day | ORAL | 0 refills | Status: AC

## 2023-08-28 MED ORDER — LOSARTAN POTASSIUM 100 MG PO TABS: 100.0000 mg | ORAL_TABLET | Freq: Every day | ORAL | 0 refills | Status: DC

## 2023-08-28 NOTE — Progress Notes (Signed)
 Patient ID: [name], female    DOB: 08/28/1980  MRN: [id number]  CC: Chronic Conditions Follow-Up  Subjective: Brie Skidgel is a 43 y.o. female who presents for chronic conditions follow-up.   Her concerns today include:  - Doing well on Losartan , no issues/concerns. She does not complain of red flag symptoms such as but not limited to chest pain, shortness of breath, worst headache of life, nausea/vomiting.  - Atorvastatin  causing upset stomach.   Patient Active Problem List   Diagnosis Date Noted   Hyperlipidemia 03/27/2022   Prediabetes 03/27/2022   Acute pansinusitis 11/15/2020   Hidradenitis 11/15/2020   Essential hypertension 03/03/2020   Obesity (BMI 30.0-34.9) 03/03/2020     Current Outpatient Medications on File Prior to Visit  Medication Sig Dispense Refill   acetaminophen (TYLENOL) 500 MG tablet Take 1,000 mg by mouth daily as needed for headache.     atorvastatin  (LIPITOR) 20 MG tablet Take 1 tablet (20 mg total) by mouth daily. (Patient not taking: Reported on 08/28/2023) 90 tablet 0   No current facility-administered medications on file prior to visit.    No Known Allergies  Social History   Socioeconomic History   Marital status: Married    Spouse name: Not on file   Number of children: Not on file   Years of education: Not on file   Highest education level: Some college, no degree  Occupational History   Not on file  Tobacco Use   Smoking status: Every Day    Types: Cigarettes    Passive exposure: Past   Smokeless tobacco: Never   Tobacco comments:    08-23-2023  pt started smoking 2007;  quit 2022;  started back smoking 12/ 2024 1/2 ppd ( 15 yrs plus 8 months)  Vaping Use   Vaping status: Never Used  Substance and Sexual Activity   Alcohol use: Yes    Alcohol/week: 2.0 standard drinks of alcohol    Types: 2 Standard drinks or equivalent per week    Comment: social   Drug use: Never   Sexual activity: Yes    Birth control/protection:  None  Other Topics Concern   Not on file  Social History Narrative   Not on file   Social Drivers of Health   Financial Resource Strain: Low Risk  (08/21/2022)   Overall Financial Resource Strain (CARDIA)    Difficulty of Paying Living Expenses: Not hard at all  Food Insecurity: No Food Insecurity (08/21/2022)   Hunger Vital Sign    Worried About Running Out of Food in the Last Year: Never true    Ran Out of Food in the Last Year: Never true  Transportation Needs: No Transportation Needs (08/21/2022)   PRAPARE - Administrator, Civil Service (Medical): No    Lack of Transportation (Non-Medical): No  Physical Activity: Insufficiently Active (08/21/2022)   Exercise Vital Sign    Days of Exercise per Week: 3 days    Minutes of Exercise per Session: 40 min  Stress: No Stress Concern Present (08/21/2022)   [employer] of Occupational Health - Occupational Stress Questionnaire    Feeling of Stress : Not at all  Social Connections: Moderately Integrated (08/21/2022)   Social Connection and Isolation Panel    Frequency of Communication with Friends and Family: More than three times a week    Frequency of Social Gatherings with Friends and Family: Twice a week    Attends Religious Services: 1 to 4 times per year  Active Member of Clubs or Organizations: No    Attends Banker Meetings: Not on file    Marital Status: Married  Intimate Partner Violence: Unknown (05/12/2021)   Received from [facility]   HITS    Physically Hurt: Not on file    Insult or Talk Down To: Not on file    Threaten Physical Harm: Not on file    Scream or Curse: Not on file    Family History  Problem Relation Age of Onset   Hypertension Mother    Heart failure Father 73   Diabetes Maternal Grandmother     Past Surgical History:  Procedure Laterality Date   DILATATION & CURETTAGE/HYSTEROSCOPY WITH MYOSURE N/A 10/27/2015   Procedure: DILATATION & CURETTAGE/HYSTEROSCOPY WITH  MYOSURE WITH RESECTION OF FIBROID;  Surgeon: Dickie Carder, MD;  Location: [facility];  Service: Gynecology;  Laterality: N/A;  Requests 1 hr.   DILATION AND EVACUATION  04/18/2006   @[facility]  by dr jinny. watt;  RPOC   POSTERIOR FUSION SPINAL DEFORMITY  1996   scoliosis   WISDOM TOOTH EXTRACTION      ROS: Review of Systems Negative except as stated above  PHYSICAL EXAM: BP (!) 135/94   Pulse 90   Temp 99.2 F (37.3 C) (Oral)   Resp 16   Ht 5' 2 (1.575 m)   Wt 167 lb 3.2 oz (75.8 kg)   LMP 07/29/2023 (Exact Date)   SpO2 97%   BMI 30.58 kg/m   Physical Exam HENT:     Head: Normocephalic and atraumatic.     Nose: Nose normal.     Mouth/Throat:     Mouth: Mucous membranes are moist.     Pharynx: Oropharynx is clear.  Eyes:     Extraocular Movements: Extraocular movements intact.     Conjunctiva/sclera: Conjunctivae normal.     Pupils: Pupils are equal, round, and reactive to light.  Cardiovascular:     Rate and Rhythm: Normal rate and regular rhythm.     Pulses: Normal pulses.     Heart sounds: Normal heart sounds.  Pulmonary:     Effort: Pulmonary effort is normal.     Breath sounds: Normal breath sounds.  Abdominal:     General: Bowel sounds are normal.     Palpations: Abdomen is soft.  Musculoskeletal:        General: Normal range of motion.     Cervical back: Normal range of motion and neck supple.  Neurological:     General: No focal deficit present.     Mental Status: She is alert and oriented to person, place, and time.  Psychiatric:        Mood and Affect: Mood normal.        Behavior: Behavior normal.    ASSESSMENT AND PLAN: 1. Primary hypertension (Primary) - Blood pressure not at goal during today's visit. Patient asymptomatic without chest pressure, chest pain, palpitations, shortness of breath, worst headache of life, and any additional red flag symptoms. - Continue Losartan  as prescribed.  - Trial Amlodipine  as prescribed.  - Counseled on blood  pressure goal of less than 130/80, low-sodium, DASH diet, medication compliance, and 150 minutes of moderate intensity exercise per week as tolerated. Counseled on medication adherence and adverse effects. - Follow-up with primary provider in 4 weeks or sooner if needed.  - losartan  (COZAAR ) 100 MG tablet; Take 1 tablet (100 mg total) by mouth daily.  Dispense: 90 tablet; Refill: 0 - amLODipine  (NORVASC ) 5 MG tablet;  Take 1 tablet (5 mg total) by mouth daily.  Dispense: 90 tablet; Refill: 0  2. Hyperlipidemia, unspecified hyperlipidemia type - Atorvastatin  discontinued due to gastrointestinal side effects.  - Trial Rosuvastatin  as prescribed. Counseled on medication adherence/adverse effects.  - Routine screening.  - Follow-up with primary provider as scheduled.  - rosuvastatin  (CRESTOR ) 5 MG tablet; Take 1 tablet (5 mg total) by mouth daily.  Dispense: 90 tablet; Refill: 0 - Lipid panel   Patient was given the opportunity to ask questions.  Patient verbalized understanding of the plan and was able to repeat key elements of the plan. Patient was given clear instructions to go to Emergency Department or return to medical center if symptoms don't improve, worsen, or new problems develop.The patient verbalized understanding.   Orders Placed This Encounter  Procedures   Lipid panel     Requested Prescriptions   Signed Prescriptions Disp Refills   losartan  (COZAAR ) 100 MG tablet 90 tablet 0    Sig: Take 1 tablet (100 mg total) by mouth daily.   amLODipine  (NORVASC ) 5 MG tablet 90 tablet 0    Sig: Take 1 tablet (5 mg total) by mouth daily.   rosuvastatin  (CRESTOR ) 5 MG tablet 90 tablet 0    Sig: Take 1 tablet (5 mg total) by mouth daily.    Follow-up with primary provider as scheduled.   Greig JINNY Drones, NP

## 2023-08-28 NOTE — Progress Notes (Signed)
 4 week follow up, cholesterol medication is bad for her stomach

## 2023-08-29 ENCOUNTER — Other Ambulatory Visit

## 2023-08-30 ENCOUNTER — Ambulatory Visit: Payer: Self-pay | Admitting: Family

## 2023-08-30 ENCOUNTER — Ambulatory Visit (HOSPITAL_COMMUNITY): Admitting: Anesthesiology

## 2023-08-30 ENCOUNTER — Other Ambulatory Visit: Payer: Self-pay

## 2023-08-30 ENCOUNTER — Encounter (HOSPITAL_COMMUNITY): Payer: Self-pay | Admitting: Obstetrics and Gynecology

## 2023-08-30 ENCOUNTER — Ambulatory Visit (HOSPITAL_COMMUNITY)
Admission: RE | Admit: 2023-08-30 | Discharge: 2023-08-30 | Disposition: A | Discharge status: Home / Self Care | Attending: Obstetrics and Gynecology | Admitting: Obstetrics and Gynecology

## 2023-08-30 ENCOUNTER — Encounter (HOSPITAL_COMMUNITY)
Admission: RE | Disposition: A | Discharge status: Home / Self Care | Payer: Self-pay | Source: Home / Self Care | Attending: Obstetrics and Gynecology

## 2023-08-30 DIAGNOSIS — Z01818 Encounter for other preprocedural examination: Secondary | ICD-10-CM

## 2023-08-30 DIAGNOSIS — N8003 Adenomyosis of the uterus: Secondary | ICD-10-CM | POA: Insufficient documentation

## 2023-08-30 DIAGNOSIS — D252 Subserosal leiomyoma of uterus: Secondary | ICD-10-CM | POA: Diagnosis not present

## 2023-08-30 DIAGNOSIS — D259 Leiomyoma of uterus, unspecified: Secondary | ICD-10-CM | POA: Diagnosis present

## 2023-08-30 DIAGNOSIS — N920 Excessive and frequent menstruation with regular cycle: Secondary | ICD-10-CM | POA: Diagnosis present

## 2023-08-30 DIAGNOSIS — D251 Intramural leiomyoma of uterus: Secondary | ICD-10-CM | POA: Insufficient documentation

## 2023-08-30 DIAGNOSIS — R7303 Prediabetes: Secondary | ICD-10-CM | POA: Diagnosis not present

## 2023-08-30 DIAGNOSIS — F17210 Nicotine dependence, cigarettes, uncomplicated: Secondary | ICD-10-CM | POA: Diagnosis not present

## 2023-08-30 DIAGNOSIS — Z90710 Acquired absence of both cervix and uterus: Secondary | ICD-10-CM | POA: Diagnosis present

## 2023-08-30 DIAGNOSIS — N921 Excessive and frequent menstruation with irregular cycle: Secondary | ICD-10-CM | POA: Diagnosis not present

## 2023-08-30 DIAGNOSIS — I10 Essential (primary) hypertension: Secondary | ICD-10-CM | POA: Diagnosis not present

## 2023-08-30 DIAGNOSIS — N72 Inflammatory disease of cervix uteri: Secondary | ICD-10-CM | POA: Diagnosis not present

## 2023-08-30 HISTORY — DX: Prediabetes: R73.03

## 2023-08-30 HISTORY — DX: Excessive and frequent menstruation with regular cycle: N92.0

## 2023-08-30 HISTORY — DX: Personal history of gestational diabetes: Z86.32

## 2023-08-30 HISTORY — DX: Hyperlipidemia, unspecified: E78.5

## 2023-08-30 HISTORY — PX: HYSTERECTOMY, TOTAL, LAPAROSCOPIC, ROBOT-ASSISTED WITH SALPINGECTOMY: SHX7587

## 2023-08-30 HISTORY — DX: Leiomyoma of uterus, unspecified: D25.9

## 2023-08-30 HISTORY — DX: Presence of spectacles and contact lenses: Z97.3

## 2023-08-30 LAB — CBC
HCT: 37.5 % (ref 36.0–46.0)
Hemoglobin: 12.3 g/dL (ref 12.0–15.0)
MCH: 28.3 pg (ref 26.0–34.0)
MCHC: 32.8 g/dL (ref 30.0–36.0)
MCV: 86.4 fL (ref 80.0–100.0)
Platelets: 332 K/uL (ref 150–400)
RBC: 4.34 MIL/uL (ref 3.87–5.11)
RDW: 13.1 % (ref 11.5–15.5)
WBC: 10.1 K/uL (ref 4.0–10.5)
nRBC: 0 % (ref 0.0–0.2)

## 2023-08-30 LAB — BASIC METABOLIC PANEL WITH GFR
Anion gap: 9 (ref 5–15)
BUN: 8 mg/dL (ref 6–20)
CO2: 20 mmol/L — ABNORMAL LOW (ref 22–32)
Calcium: 8.4 mg/dL — ABNORMAL LOW (ref 8.9–10.3)
Chloride: 107 mmol/L (ref 98–111)
Creatinine, Ser: 0.9 mg/dL (ref 0.44–1.00)
GFR, Estimated: >60 mL/min (ref >60)
Glucose, Bld: 210 mg/dL — ABNORMAL HIGH (ref 70–99)
Potassium: 4 mmol/L (ref 3.5–5.1)
Sodium: 136 mmol/L (ref 135–145)

## 2023-08-30 LAB — LIPID PANEL
Chol/HDL Ratio: 2.8 ratio (ref 0.0–4.4)
Cholesterol, Total: 141 mg/dL (ref 100–199)
HDL: 50 mg/dL (ref >39)
LDL Chol Calc (NIH): 78 mg/dL (ref 0–99)
Triglycerides: 66 mg/dL (ref 0–149)
VLDL Cholesterol Cal: 13 mg/dL (ref 5–40)

## 2023-08-30 LAB — POCT PREGNANCY, URINE: Preg Test, Ur: NEGATIVE

## 2023-08-30 SURGERY — HYSTERECTOMY, TOTAL, LAPAROSCOPIC, ROBOT-ASSISTED WITH SALPINGECTOMY
Anesthesia: General | Site: Pelvis

## 2023-08-30 MED ORDER — OXYCODONE HCL 5 MG/5ML PO SOLN: 5.0000 mg | Freq: Once | ORAL | Status: DC | PRN

## 2023-08-30 MED ORDER — LIDOCAINE 2% (20 MG/ML) 5 ML SYRINGE
INTRAMUSCULAR | Status: AC
  Filled 2023-08-30: qty 5

## 2023-08-30 MED ORDER — LACTATED RINGERS IV SOLN: INTRAVENOUS | Status: DC

## 2023-08-30 MED ORDER — FENTANYL CITRATE (PF) 100 MCG/2ML IJ SOLN
25.0000 ug | INTRAMUSCULAR | Status: DC | PRN
  Administered 2023-08-30 (×2): 25 ug via INTRAVENOUS

## 2023-08-30 MED ORDER — KETOROLAC TROMETHAMINE 30 MG/ML IJ SOLN
INTRAMUSCULAR | Status: AC
Start: 2023-08-30 — End: 2023-08-30
  Filled 2023-08-30: qty 1

## 2023-08-30 MED ORDER — ONDANSETRON HCL 4 MG PO TABS: 4.0000 mg | ORAL_TABLET | Freq: Four times a day (QID) | ORAL | Status: DC | PRN

## 2023-08-30 MED ORDER — SIMETHICONE 80 MG PO CHEW: 80.0000 mg | CHEWABLE_TABLET | Freq: Four times a day (QID) | ORAL | Status: DC | PRN

## 2023-08-30 MED ORDER — 0.9 % SODIUM CHLORIDE (POUR BTL) OPTIME
TOPICAL | Status: DC | PRN
  Administered 2023-08-30: 1000 mL

## 2023-08-30 MED ORDER — PHENYLEPHRINE HCL-NACL 20-0.9 MG/250ML-% IV SOLN
INTRAVENOUS | Status: DC | PRN
  Administered 2023-08-30: 30 ug/min via INTRAVENOUS

## 2023-08-30 MED ORDER — IBUPROFEN 800 MG PO TABS: 800.0000 mg | ORAL_TABLET | Freq: Three times a day (TID) | ORAL | 2 refills | Status: AC

## 2023-08-30 MED ORDER — CEFAZOLIN SODIUM-DEXTROSE 2-4 GM/100ML-% IV SOLN
INTRAVENOUS | Status: AC
  Filled 2023-08-30: qty 100

## 2023-08-30 MED ORDER — MIDAZOLAM HCL 2 MG/2ML IJ SOLN
INTRAMUSCULAR | Status: AC
  Filled 2023-08-30: qty 2

## 2023-08-30 MED ORDER — PROPOFOL 10 MG/ML IV BOLUS
INTRAVENOUS | Status: DC | PRN
  Administered 2023-08-30: 150 mg via INTRAVENOUS

## 2023-08-30 MED ORDER — DEXAMETHASONE SODIUM PHOSPHATE 10 MG/ML IJ SOLN
INTRAMUSCULAR | Status: AC
  Filled 2023-08-30: qty 1

## 2023-08-30 MED ORDER — KETAMINE HCL 10 MG/ML IJ SOLN
INTRAMUSCULAR | Status: DC | PRN
  Administered 2023-08-30 (×2): 10 mg via INTRAVENOUS
  Administered 2023-08-30: 20 mg via INTRAVENOUS

## 2023-08-30 MED ORDER — DEXAMETHASONE SODIUM PHOSPHATE 10 MG/ML IJ SOLN
INTRAMUSCULAR | Status: DC | PRN
  Administered 2023-08-30: 5 mg via INTRAVENOUS

## 2023-08-30 MED ORDER — CEFAZOLIN SODIUM-DEXTROSE 2-4 GM/100ML-% IV SOLN
2.0000 g | INTRAVENOUS | Status: AC
  Administered 2023-08-30: 2 g via INTRAVENOUS

## 2023-08-30 MED ORDER — HEMOSTATIC AGENTS (NO CHARGE) OPTIME
TOPICAL | Status: DC | PRN
  Administered 2023-08-30: 1 via TOPICAL

## 2023-08-30 MED ORDER — AMISULPRIDE (ANTIEMETIC) 5 MG/2ML IV SOLN: 10.0000 mg | Freq: Once | INTRAVENOUS | Status: DC | PRN

## 2023-08-30 MED ORDER — ACETAMINOPHEN 500 MG PO TABS
1000.0000 mg | ORAL_TABLET | Freq: Once | ORAL | Status: AC
  Administered 2023-08-30: 1000 mg via ORAL

## 2023-08-30 MED ORDER — ROCURONIUM BROMIDE 10 MG/ML (PF) SYRINGE
PREFILLED_SYRINGE | INTRAVENOUS | Status: DC | PRN
  Administered 2023-08-30: 70 mg via INTRAVENOUS
  Administered 2023-08-30 (×2): 10 mg via INTRAVENOUS

## 2023-08-30 MED ORDER — CHLORHEXIDINE GLUCONATE 0.12 % MT SOLN
OROMUCOSAL | Status: DC
Start: 2023-08-30 — End: 2023-08-30
  Filled 2023-08-30: qty 15

## 2023-08-30 MED ORDER — POVIDONE-IODINE 10 % EX SWAB: 2.0000 | Freq: Once | CUTANEOUS | Status: DC

## 2023-08-30 MED ORDER — SODIUM CHLORIDE 0.9 % IV SOLN: INTRAVENOUS | Status: DC | PRN

## 2023-08-30 MED ORDER — PANTOPRAZOLE SODIUM 40 MG PO TBEC
40.0000 mg | DELAYED_RELEASE_TABLET | Freq: Every day | ORAL | Status: DC
  Administered 2023-08-30: 40 mg via ORAL
  Filled 2023-08-30: qty 1

## 2023-08-30 MED ORDER — MIDAZOLAM HCL 2 MG/2ML IJ SOLN
INTRAMUSCULAR | Status: DC | PRN
  Administered 2023-08-30: 2 mg via INTRAVENOUS

## 2023-08-30 MED ORDER — CHLORHEXIDINE GLUCONATE 0.12 % MT SOLN
15.0000 mL | Freq: Once | OROMUCOSAL | Status: AC
  Administered 2023-08-30: 15 mL via OROMUCOSAL

## 2023-08-30 MED ORDER — SUGAMMADEX SODIUM 200 MG/2ML IV SOLN
INTRAVENOUS | Status: AC
  Filled 2023-08-30: qty 2

## 2023-08-30 MED ORDER — PHENYLEPHRINE 80 MCG/ML (10ML) SYRINGE FOR IV PUSH (FOR BLOOD PRESSURE SUPPORT)
PREFILLED_SYRINGE | INTRAVENOUS | Status: DC | PRN
  Administered 2023-08-30 (×3): 80 ug via INTRAVENOUS

## 2023-08-30 MED ORDER — KETAMINE HCL 50 MG/5ML IJ SOSY
PREFILLED_SYRINGE | INTRAMUSCULAR | Status: AC
  Filled 2023-08-30: qty 5

## 2023-08-30 MED ORDER — ACETAMINOPHEN 500 MG PO TABS: 1000.0000 mg | ORAL_TABLET | Freq: Every day | ORAL | Status: DC | PRN

## 2023-08-30 MED ORDER — LOSARTAN POTASSIUM 25 MG PO TABS: 100.0000 mg | ORAL_TABLET | Freq: Every day | ORAL | Status: DC

## 2023-08-30 MED ORDER — BUPIVACAINE HCL (PF) 0.25 % IJ SOLN
INTRAMUSCULAR | Status: AC
  Filled 2023-08-30: qty 30

## 2023-08-30 MED ORDER — MENTHOL 3 MG MT LOZG: 1.0000 | LOZENGE | OROMUCOSAL | Status: DC | PRN

## 2023-08-30 MED ORDER — FENTANYL CITRATE (PF) 250 MCG/5ML IJ SOLN
INTRAMUSCULAR | Status: AC
  Filled 2023-08-30: qty 5

## 2023-08-30 MED ORDER — PHENYLEPHRINE 80 MCG/ML (10ML) SYRINGE FOR IV PUSH (FOR BLOOD PRESSURE SUPPORT)
PREFILLED_SYRINGE | INTRAVENOUS | Status: AC
  Filled 2023-08-30: qty 10

## 2023-08-30 MED ORDER — DEXTROSE IN LACTATED RINGERS 5 % IV SOLN: INTRAVENOUS | Status: DC

## 2023-08-30 MED ORDER — FENTANYL CITRATE (PF) 100 MCG/2ML IJ SOLN
INTRAMUSCULAR | Status: AC
  Filled 2023-08-30: qty 2

## 2023-08-30 MED ORDER — ONDANSETRON HCL 4 MG/2ML IJ SOLN
INTRAMUSCULAR | Status: DC | PRN
  Administered 2023-08-30: 4 mg via INTRAVENOUS

## 2023-08-30 MED ORDER — BUPIVACAINE HCL (PF) 0.25 % IJ SOLN
INTRAMUSCULAR | Status: DC | PRN
  Administered 2023-08-30: 15 mL

## 2023-08-30 MED ORDER — OXYCODONE HCL 5 MG PO TABS: 5.0000 mg | ORAL_TABLET | Freq: Once | ORAL | Status: DC | PRN

## 2023-08-30 MED ORDER — ROCURONIUM BROMIDE 10 MG/ML (PF) SYRINGE
PREFILLED_SYRINGE | INTRAVENOUS | Status: AC
  Filled 2023-08-30: qty 10

## 2023-08-30 MED ORDER — OXYCODONE HCL 5 MG PO TABS
5.0000 mg | ORAL_TABLET | ORAL | Status: DC | PRN
  Administered 2023-08-30 (×2): 5 mg via ORAL
  Filled 2023-08-30 (×2): qty 1

## 2023-08-30 MED ORDER — OXYCODONE HCL 5 MG PO TABS: 5.0000 mg | ORAL_TABLET | ORAL | 0 refills | Status: AC | PRN

## 2023-08-30 MED ORDER — LIDOCAINE 2% (20 MG/ML) 5 ML SYRINGE
INTRAMUSCULAR | Status: DC | PRN
  Administered 2023-08-30: 80 mg via INTRAVENOUS

## 2023-08-30 MED ORDER — ORAL CARE MOUTH RINSE: 15.0000 mL | Freq: Once | OROMUCOSAL | Status: AC

## 2023-08-30 MED ORDER — ONDANSETRON HCL 4 MG/2ML IJ SOLN
INTRAMUSCULAR | Status: AC
  Filled 2023-08-30: qty 2

## 2023-08-30 MED ORDER — SUGAMMADEX SODIUM 200 MG/2ML IV SOLN
INTRAVENOUS | Status: DC | PRN
  Administered 2023-08-30: 200 mg via INTRAVENOUS

## 2023-08-30 MED ORDER — ACETAMINOPHEN 500 MG PO TABS
ORAL_TABLET | ORAL | Status: AC
  Filled 2023-08-30: qty 2

## 2023-08-30 MED ORDER — AMLODIPINE BESYLATE 5 MG PO TABS: 5.0000 mg | ORAL_TABLET | Freq: Every day | ORAL | Status: DC

## 2023-08-30 MED ORDER — SODIUM CHLORIDE 0.9 % IR SOLN
Status: DC | PRN
Start: 2023-08-30 — End: 2023-08-30
  Administered 2023-08-30: 1000 mL

## 2023-08-30 MED ORDER — FENTANYL CITRATE (PF) 250 MCG/5ML IJ SOLN
INTRAMUSCULAR | Status: DC | PRN
  Administered 2023-08-30 (×2): 100 ug via INTRAVENOUS

## 2023-08-30 MED ORDER — PROPOFOL 10 MG/ML IV BOLUS
INTRAVENOUS | Status: AC
Start: 2023-08-30 — End: 2023-08-30
  Filled 2023-08-30: qty 20

## 2023-08-30 MED ORDER — ONDANSETRON HCL 4 MG/2ML IJ SOLN: 4.0000 mg | Freq: Four times a day (QID) | INTRAMUSCULAR | Status: DC | PRN

## 2023-08-30 MED ORDER — KETOROLAC TROMETHAMINE 30 MG/ML IJ SOLN
INTRAMUSCULAR | Status: DC | PRN
Start: 2023-08-30 — End: 2023-08-30
  Administered 2023-08-30: 30 mg via INTRAVENOUS

## 2023-08-30 SURGICAL SUPPLY — 51 items
APPLICATOR ARISTA FLEXITIP XL (MISCELLANEOUS) IMPLANT
BAG URINE DRAIN 2000ML AR STRL (UROLOGICAL SUPPLIES) IMPLANT
CATH FOLEY 3WAY 5CC 16FR (CATHETERS) ×1 IMPLANT
COVER BACK TABLE 60X90IN (DRAPES) ×1 IMPLANT
COVER TIP SHEARS 8 DVNC (MISCELLANEOUS) ×1 IMPLANT
DEFOGGER SCOPE WARM SEASHARP (MISCELLANEOUS) ×1 IMPLANT
DERMABOND ADVANCED .7 DNX12 (GAUZE/BANDAGES/DRESSINGS) ×1 IMPLANT
DRAPE ARM DVNC X/XI (DISPOSABLE) ×4 IMPLANT
DRAPE COLUMN DVNC XI (DISPOSABLE) ×1 IMPLANT
DRAPE SURG IRRIG POUCH 19X23 (DRAPES) ×1 IMPLANT
DRAPE UTILITY XL STRL (DRAPES) ×1 IMPLANT
DRIVER NDL MEGA SUTCUT DVNCXI (INSTRUMENTS) ×1 IMPLANT
DRIVER NDLE MEGA SUTCUT DVNCXI (INSTRUMENTS) ×1 IMPLANT
DURAPREP 26ML APPLICATOR (WOUND CARE) ×1 IMPLANT
ELECTRODE REM PT RTRN 9FT ADLT (ELECTROSURGICAL) ×1 IMPLANT
FORCEPS BPLR LNG DVNC XI (INSTRUMENTS) ×1 IMPLANT
FORCEPS LONG TIP 8 DVNC XI (FORCEP) ×1 IMPLANT
GLOVE BIOGEL PI IND STRL 7.0 (GLOVE) ×5 IMPLANT
GLOVE ECLIPSE 6.5 STRL STRAW (GLOVE) ×3 IMPLANT
HEMOSTAT ARISTA ABSORB 3G PWDR (HEMOSTASIS) IMPLANT
HIBICLENS CHG 4% 4OZ BTL (MISCELLANEOUS) ×1 IMPLANT
HOLDER FOLEY CATH W/STRAP (MISCELLANEOUS) IMPLANT
IRRIGATION STRYKERFLOW (MISCELLANEOUS) ×1 IMPLANT
IV NS 1000ML BAXH (IV SOLUTION) IMPLANT
KIT PINK PAD W/HEAD ARM REST (MISCELLANEOUS) ×1 IMPLANT
LEGGING LITHOTOMY PAIR STRL (DRAPES) ×1 IMPLANT
NDL INSUFFLATION 14GA 120MM (NEEDLE) ×1 IMPLANT
NEEDLE INSUFFLATION 14GA 120MM (NEEDLE) ×1 IMPLANT
OBTURATOR OPTICALSTD 8 DVNC (TROCAR) ×1 IMPLANT
OCCLUDER COLPOPNEUMO (BALLOONS) ×1 IMPLANT
PACK ROBOT WH (CUSTOM PROCEDURE TRAY) ×1 IMPLANT
PACK ROBOTIC GOWN (GOWN DISPOSABLE) ×1 IMPLANT
PAD OB MATERNITY 11 LF (PERSONAL CARE ITEMS) ×1 IMPLANT
SCISSORS MNPLR CVD DVNC XI (INSTRUMENTS) ×1 IMPLANT
SEAL UNIV 5-12 XI (MISCELLANEOUS) ×4 IMPLANT
SEALER VESSEL EXT DVNC XI (MISCELLANEOUS) ×1 IMPLANT
SET IRRIG Y TYPE TUR BLADDER L (SET/KITS/TRAYS/PACK) IMPLANT
SET TRI-LUMEN FLTR TB AIRSEAL (TUBING) ×1 IMPLANT
SPIKE FLUID TRANSFER (MISCELLANEOUS) ×1 IMPLANT
SUT VIC AB 0 CT1 36 (SUTURE) ×2 IMPLANT
SUT VIC AB 4-0 PS2 18 (SUTURE) ×2 IMPLANT
SUT VLOC 180 0 9IN GS21 (SUTURE) ×1 IMPLANT
TIP RUMI ORANGE 6.7MMX12CM (TIP) IMPLANT
TIP UTERINE 5.1X6CM LAV DISP (MISCELLANEOUS) IMPLANT
TIP UTERINE 6.7X10CM GRN DISP (MISCELLANEOUS) IMPLANT
TIP UTERINE 6.7X6CM WHT DISP (MISCELLANEOUS) IMPLANT
TIP UTERINE 6.7X8CM BLUE DISP (MISCELLANEOUS) IMPLANT
TOWEL GREEN STERILE (TOWEL DISPOSABLE) ×1 IMPLANT
TROCAR PORT AIRSEAL 8X120 (TROCAR) ×1 IMPLANT
UNDERPAD 30X36 HEAVY ABSORB (UNDERPADS AND DIAPERS) ×1 IMPLANT
WATER STERILE IRR 1000ML POUR (IV SOLUTION) ×1 IMPLANT

## 2023-08-30 NOTE — Brief Op Note (Signed)
   08/30/2023  10:14 AM  PATIENT:  [name]  43 y.o. female  PRE-OPERATIVE DIAGNOSIS:  Menorrhagia Intramural, submucosal and subserosal fibroid  POST-OPERATIVE DIAGNOSIS:  Menorrhagia, Intramural, submucosal and subserosal fibroid  PROCEDURE:  DaVinci robotic total hysterectomy, bilateral salpingectomy  SURGEON:  Surgeons and Role:    * Rutherford Gain, MD - Primary  PHYSICIAN ASSISTANT:   ASSISTANTS: Daine Abbot RNFA   ANESTHESIA:   general  EBL:  15 mL   BLOOD ADMINISTERED:none  DRAINS: none   LOCAL MEDICATIONS USED:  MARCAINE      SPECIMEN:  Source of Specimen:  uterus with cervix, tubes  DISPOSITION OF SPECIMEN:  PATHOLOGY  COUNTS:  YES  TOURNIQUET:  * No tourniquets in log *  DICTATION: .Other Dictation: Dictation Number [id number]  PLAN OF CARE: Admit for overnight observation  PATIENT DISPOSITION:  PACU - hemodynamically stable.   Delay start of Pharmacological VTE agent (>24hrs) due to surgical blood loss or risk of bleeding: no

## 2023-08-30 NOTE — Plan of Care (Signed)

## 2023-08-30 NOTE — Progress Notes (Signed)
 Subjective: Patient reports tolerating PO and no problems voiding.  Reviewed intra-op findings  Objective: I have reviewed patient's vital signs.  vital signs. Vitals:   08/30/23 1123 08/30/23 1203  BP: 134/86 (!) 145/97  Pulse: 77 79  Resp: 17   Temp: 98.1 F (36.7 C) 98.3 F (36.8 C)  SpO2: 96%    No intake/output data recorded. Total I/O In: 1100 [I.V.:1100] Out: 415 [Urine:400; Blood:15]  Lab Results  Component Value Date   WBC 8.3 08/28/2023   HGB 13.6 08/28/2023   HCT 41.8 08/28/2023   MCV 85.8 08/28/2023   PLT 358 08/28/2023   Lab Results  Component Value Date   CREATININE 0.72 08/28/2023    EXAM General: alert, cooperative, and no distress Resp: clear to auscultation bilaterally Cardio: regular rate and rhythm GI: incision: clean, dry, and intact and soft obese, active bowel sounds Extremities: no edema, redness or tenderness in the calves or thighs Vaginal Bleeding: none  Assessment: s/p Procedure(s): HYSTERECTOMY, TOTAL, LAPAROSCOPIC, ROBOT-ASSISTED WITH SALPINGECTOMY: stable  Plan: Encourage ambulation Discontinue IV fluids Discharge home pending labs D/c instructions reviewed F/u 2 wk Scripts( motrin  and oxycodone ) sent  LOS: 0 days    Evelyn DELENA Carder, MD 08/30/2023 3:13 PM    08/30/2023, 3:13 PM

## 2023-08-30 NOTE — Discharge Instructions (Signed)
Call if temperature greater than equal to 100.4, nothing per vagina for 4-6 weeks or severe nausea vomiting, increased incisional pain , drainage or redness in the incision site, no straining with bowel movements, showers no bath °

## 2023-08-30 NOTE — Transfer of Care (Signed)
 Immediate Anesthesia Transfer of Care Note  Patient: [name]  Procedure(s) Performed: HYSTERECTOMY, TOTAL, LAPAROSCOPIC, ROBOT-ASSISTED WITH SALPINGECTOMY (Pelvis)  Patient Location: PACU  Anesthesia Type:General  Level of Consciousness: awake, alert , and oriented  Airway & Oxygen Therapy: Patient Spontanous Breathing and Patient connected to face mask oxygen  Post-op Assessment: Report given to RN and Post -op Vital signs reviewed and stable  Post vital signs: Reviewed and stable  Last Vitals:  Vitals Value Taken Time  BP 132/87 08/30/23 10:17  Temp    Pulse 92 08/30/23 10:18  Resp 17 08/30/23 10:19  SpO2 98 % 08/30/23 10:18  Vitals shown include unfiled device data.  Last Pain:  Vitals:   08/30/23 0601  TempSrc: Oral  PainSc: 0-No pain      Patients Stated Pain Goal: 5 (08/30/23 0601)  Complications: No notable events documented.

## 2023-08-30 NOTE — H&P (Signed)
 [name] is an 43 y.o. female. [id number] MBF presents for surgical mgmt of symptomatic uterine fibroids. Pt declined [place]. She is scheduled for Davinci robotic total hysterectomy, bilateral salpingectomy. EBx showed benign pathology  Pertinent Gynecological History: Menses: menorrhagia with irregular cycles Bleeding: menorrhagia Contraception: none DES exposure: denies Blood transfusions: none Sexually transmitted diseases: no past history Previous GYN Procedures: DNC  Last mammogram: normal Date: 2022 Last pap: normal Date: 02/12/2023 OB History: G3, P2012   Menstrual History: Menarche age: n/a Patient's last menstrual period was 07/29/2023 (exact date).    Past Medical History:  Diagnosis Date   Essential hypertension 03/03/2020   History of gestational diabetes    Hyperlipidemia    Menorrhagia    Pre-diabetes    Scoliosis    s/p  spinal fusion age 45   Uterine fibroid    intramural/ submucosal/ subserosal   Wears contact lenses     Past Surgical History:  Procedure Laterality Date   DILATATION & CURETTAGE/HYSTEROSCOPY WITH MYOSURE N/A 10/27/2015   Procedure: DILATATION & CURETTAGE/HYSTEROSCOPY WITH MYOSURE WITH RESECTION OF FIBROID;  Surgeon: Dickie Carder, MD;  Location: [facility];  Service: Gynecology;  Laterality: N/A;  Requests 1 hr.   DILATION AND EVACUATION  04/18/2006   @[facility]  by dr jinny. watt;  RPOC   POSTERIOR FUSION SPINAL DEFORMITY  1996   scoliosis   WISDOM TOOTH EXTRACTION      Family History  Problem Relation Age of Onset   Hypertension Mother    Heart failure Father 5   Diabetes Maternal Grandmother     Social History:  reports that she has been smoking cigarettes. She has been exposed to tobacco smoke. She has never used smokeless tobacco. She reports current alcohol use of about 2.0 standard drinks of alcohol per week. She reports that she does not use drugs.  Allergies: No Known Allergies  Medications Prior to Admission  Medication Sig  Dispense Refill Last Dose/Taking   acetaminophen (TYLENOL) 500 MG tablet Take 1,000 mg by mouth daily as needed for headache.   Past Week   losartan  (COZAAR ) 100 MG tablet Take 1 tablet (100 mg total) by mouth daily. 90 tablet 0 08/29/2023   amLODipine  (NORVASC ) 5 MG tablet Take 1 tablet (5 mg total) by mouth daily. 90 tablet 0 Unknown   atorvastatin  (LIPITOR) 20 MG tablet Take 1 tablet (20 mg total) by mouth daily. 90 tablet 0 More than a month   rosuvastatin  (CRESTOR ) 5 MG tablet Take 1 tablet (5 mg total) by mouth daily. 90 tablet 0 Unknown    Review of Systems  All other systems reviewed and are negative.   Blood pressure (!) 134/92, temperature 98.5 F (36.9 C), temperature source Oral, resp. rate 17, height 5' 2 (1.575 m), weight 75.8 kg, last menstrual period 07/29/2023, SpO2 99%. Physical Exam Constitutional:      Appearance: Normal appearance.  Eyes:     Extraocular Movements: Extraocular movements intact.  Cardiovascular:     Rate and Rhythm: Regular rhythm.     Heart sounds: Normal heart sounds.  Pulmonary:     Breath sounds: Normal breath sounds.  Abdominal:     Palpations: Abdomen is soft.  Genitourinary:    General: Normal vulva.     Comments: Vagina: nl lesion Cervix parous Uterus irregular 8 wk uterus Adnexa nl Musculoskeletal:        General: Normal range of motion.     Cervical back: Neck supple.  Skin:    General: Skin is warm  and dry.  Neurological:     General: No focal deficit present.     Mental Status: She is alert and oriented to person, place, and time.  Psychiatric:        Mood and Affect: Mood normal.        Behavior: Behavior normal.     Results for orders placed or performed during the hospital encounter of 08/30/23 (from the past 24 hours)  Pregnancy, urine POC     Status: None   Collection Time: 08/30/23  5:43 AM  Result Value Ref Range   Preg Test, Ur NEGATIVE NEGATIVE    No results found.  Assessment/Plan: Menorrhagia with  irregular cycles IM/SM/SS fibroids Dysmenorrhea P) Davinci robotic total hysterectomy, bilateral salpingectomy. Procedure explained. Risk of surgery reviewed including infection, bleeding, injury to bladder, bowel, ureter, possible need for surgery in the future due to ovarian disease, possible need for blood transfusion and its risk, thermal injury. All ? answered  Aras Albarran A Latrail Pounders 08/30/2023, 6:31 AM

## 2023-08-30 NOTE — Anesthesia Procedure Notes (Signed)
 Procedure Name: Intubation Date/Time: 08/30/2023 7:39 AM  Performed by: Winnona Wargo C, CRNAPre-anesthesia Checklist: Patient identified, Emergency Drugs available, Suction available and Patient being monitored Patient Re-evaluated:Patient Re-evaluated prior to induction Oxygen Delivery Method: Circle system utilized Preoxygenation: Pre-oxygenation with 100% oxygen Induction Type: IV induction Ventilation: Mask ventilation without difficulty Laryngoscope Size: Mac and 3 Grade View: Grade II Tube type: Oral Tube size: 7.0 mm Number of attempts: 1 Airway Equipment and Method: Stylet and Oral airway Placement Confirmation: ETT inserted through vocal cords under direct vision, positive ETCO2 and breath sounds checked- equal and bilateral Secured at: 21 cm Tube secured with: Tape Dental Injury: Teeth and Oropharynx as per pre-operative assessment

## 2023-08-30 NOTE — Anesthesia Preprocedure Evaluation (Addendum)
 Anesthesia Evaluation  Patient identified by MRN, date of birth, ID band Patient awake    Reviewed: Allergy & Precautions, NPO status , Patient's Chart, lab work & pertinent test results  Airway Mallampati: III  TM Distance: >3 FB Neck ROM: Full    Dental no notable dental hx. (+) Teeth Intact, Dental Advisory Given   Pulmonary Current Smoker and Patient abstained from smoking.   Pulmonary exam normal breath sounds clear to auscultation       Cardiovascular hypertension, Pt. on medications Normal cardiovascular exam Rhythm:Regular Rate:Normal     Neuro/Psych negative neurological ROS  negative psych ROS   GI/Hepatic negative GI ROS, Neg liver ROS,,,  Endo/Other  diabetes (prediabetes)    Renal/GU negative Renal ROS  negative genitourinary   Musculoskeletal negative musculoskeletal ROS (+)    Abdominal   Peds  Hematology negative hematology ROS (+)   Anesthesia Other Findings   Reproductive/Obstetrics                              Anesthesia Physical Anesthesia Plan  ASA: 2  Anesthesia Plan: General   Post-op Pain Management: Tylenol PO (pre-op)*, Ketamine IV* and Dilaudid IV   Induction: Intravenous  PONV Risk Score and Plan: 2 and Midazolam , Dexamethasone  and Ondansetron   Airway Management Planned: Oral ETT  Additional Equipment:   Intra-op Plan:   Post-operative Plan: Extubation in OR  Informed Consent: I have reviewed the patients History and Physical, chart, labs and discussed the procedure including the risks, benefits and alternatives for the proposed anesthesia with the patient or authorized representative who has indicated his/her understanding and acceptance.     Dental advisory given  Plan Discussed with: CRNA  Anesthesia Plan Comments: (2 IVs)         Anesthesia Quick Evaluation

## 2023-08-30 NOTE — Op Note (Signed)
 NAME: Kwolek, [name] MEDICAL RECORD NO: [id number] ACCOUNT NO: [id number] DATE OF BIRTH: 12-26-1980 FACILITY: [facility] LOCATION: [facility] PHYSICIAN: Jay Kempe A. Rutherford, MD  Operative Report   DATE OF PROCEDURE: 08/30/2023  PREOPERATIVE DIAGNOSIS:  Menorrhagia with irregular cycles, intramural submucosal, subserosal fibroids, and dysmenorrhea.  PROCEDURE:  Da Vinci robotic total hysterectomy and bilateral salpingectomy.  POSTOPERATIVE DIAGNOSIS:  Menorrhagia with irregular cycles, intramural submucosal, subserosal fibroids, and dysmenorrhea.  ANESTHESIA:  General.  SURGEON:  Burlin Mcnair A. Rutherford, MD  ASSISTANT:  Dwayne Abbot, RNFA.  DESCRIPTION OF PROCEDURE:  Under adequate general anesthesia, the patient was placed in a dorsal lithotomy position.  The patient was positioned for robotic surgery.  Examination under anesthesia revealed a about 10-week anteverted, irregular.  No  adnexal masses were appreciated.  An indwelling Foley catheter was sterilely placed.  It was draining cranberry-colored urine with Pyridium.  A weighted speculum was placed in the vagina.  A Sims retractor was placed anteriorly.  A 0 Vicryl figure-of-eight  suture was placed on the anterior and posterior lip of the cervix.  The uterus sounded to 9 cm.  The cervix was serially dilated.  An 8-mm uterine manipulator with a small KOH Rumi cup was inserted, and the retractor was removed.  Attention was then  turned to the abdomen.  A supraumbilical incision was made after 0.25% Marcaine  was placed.  A Veress needle was placed.  Saline testing was done.  Three liters of CO2 were insufflated.  The Veress needle was removed.  A robotic trocar was inserted into  the abdomen.  A lighted robotic camera was inserted, showing no evidence of trauma from the placement in the trocar.  Panoramic inspection was notable for a normal liver and a large amount of bowels.  The patient was placed in deep Trendelenburg  position.  The pelvis  was inspected.  The right ovary had a 3 cm cystic mass.  The left ovary was normal.  Elongated bilateral fallopian tubes were noted.  The procedure was continued on the far left, a robotic port site was placed.  Intervening to that was  an 8-mm AirSeal, and on the right, two robotic ports, 9 cm apart, after injection of Marcaine  was also placed, all under direct visualization.  The robot was then docked.  In arm #1 was the vessel sealer.  Arm #4 was the monopolar scissors.  Arm #3 was  the bipolar grasper.  I then went to the surgical console.  At the surgical console, both ureters were noted to be peristalsing deep in the pelvis.  The right ovary indeed had a cystic mass attached to an epiploic part of the bowels, which was also  attached on the posterior aspect of the cul-de-sac.  Blunt dissection was then done to take the ovary off of that adnexal area, and the epiploica was easily bluntly removed.  The procedure was started with the left mesosalpinx, which was serially  clamped, cauterized, and cut.  The left retroperitoneal space was opened.  A small window was placed in the broad ligament.  The left utero-ovarian ligament was serially clamped, cauterized, and cut.  The round ligament was clamped, cauterized, and cut.   The anterior vesicouterine peritoneum was opened transversely.  The bladder was sharply dissected and displaced inferiorly.  The uterine vessels were skeletonized.  The uterine vessels were clamped, cauterized, and cut.  The same procedure was performed  on the contralateral side.  Once that was done, the vesicouterine peritoneum was opened anteriorly, and the bladder  was displaced further inferiorly.  Circumferentially, the cervicovaginal junction was carried around using both bipolar cautery as well  as scissors, with the cervicovaginal junction being cut and the uterus removed through the vagina.  There was bleeding on the right anteriorly, which was cauterized.  The insufflator was  then placed in the vagina.  The bladder was displaced further  inferiorly.  Once there was good hemostasis, a 0 V-Loc suture was placed.  The vessel sealer was replaced by long tip forceps, and the Monopolar scissors was replaced by the mega suture needle driver.  Once this was done, the vaginal cuff was closed with a 0  V-Loc running stitch. The suture was inadvertently cut on the right, and a second suture of 0 V-Loc was placed, starting on the right side and carried to halfway to the midline and then also cut.  The vaginal cuff was inspected intravaginally, with good  approximation noted.  The pelvis was irrigated and suctioned.  The pressure was decreased  to 8 mm in order to assess for any bleeding.  Good hemostasis again was noted.  Attention was then placed on the right ovarian cyst. The Monopolar scissor was then reinserted and a small incision was placed over the cyst where suspicion for endometrioma. Bloody fluid was noted consistent with hemorrhagic cyst. The cyst was not removed. Cauterization of the bleeding edges was done. Good hemostasis was noted.   At that point, the procedure was felt to be completed.  Arista potato starch was placed over  the vaginal cuff area, and the robot instruments were removed under direct visualization and the robot was then undocked.  I then went back to the patient's bedside sterilely and under direct visualization, the ports were removed.  The AirSeal was removed after abdomen was deflated.   The vaginal cuff was  visually and digitally inspected.  Good hemostasis and  vaginal cuff approximation was noted.  The incisions were closed with 4-0 Vicryl sutures.  Dermabond was placed.  SPECIMEN:  Uterus with cervix and fallopian tubes sent to pathology.  ESTIMATED BLOOD LOSS:  50 mL.  INTRAOPERATIVE FLUIDS:  1 liter.  URINE OUTPUT:  150 mL of Pyridium-colored urine.  COUNTS:  Sponges and instruments counts x2 is correct.  COMPLICATIONS:  None.  DISPOSITION:   The patient tolerated the procedure well and was transferred to the recovery room in stable condition.   PUS D: 08/30/2023 10:54:51 am T: 08/30/2023 11:18:00 am  JOB: [id number]/ [id number]

## 2023-08-30 NOTE — Interval H&P Note (Signed)
 History and Physical Interval Note:  08/30/2023 7:21 AM  [name]  has presented today for surgery, with the diagnosis of Menorrhagia Intramural, submucosal and subserosal fibroid.  The various methods of treatment have been discussed with the patient and family. After consideration of risks, benefits and other options for treatment, the patient has consented to  Procedure(s): HYSTERECTOMY, TOTAL, LAPAROSCOPIC, ROBOT-ASSISTED WITH SALPINGECTOMY (N/A) as a surgical intervention.  The patient's history has been reviewed, patient examined, no change in status, stable for surgery.  I have reviewed the patient's chart and labs.  Questions were answered to the patient's satisfaction.     Alva Kuenzel A Philomena Buttermore

## 2023-08-30 NOTE — Anesthesia Postprocedure Evaluation (Signed)
 Anesthesia Post Note  Patient: Rendi F Mcbreen  Procedure(s) Performed: HYSTERECTOMY, TOTAL, LAPAROSCOPIC, ROBOT-ASSISTED WITH SALPINGECTOMY (Pelvis)     Patient location during evaluation: PACU Anesthesia Type: General Level of consciousness: awake and alert Pain management: pain level controlled Vital Signs Assessment: post-procedure vital signs reviewed and stable Respiratory status: spontaneous breathing, nonlabored ventilation, respiratory function stable and patient connected to nasal cannula oxygen Cardiovascular status: blood pressure returned to baseline and stable Postop Assessment: no apparent nausea or vomiting Anesthetic complications: no   No notable events documented.  Last Vitals:  Vitals:   08/30/23 1123 08/30/23 1203  BP: 134/86 (!) 145/97  Pulse: 77 79  Resp: 17   Temp: 36.7 C 36.8 C  SpO2: 96%     Last Pain:  Vitals:   08/30/23 1230  TempSrc:   PainSc: 6    Pain Goal: Patients Stated Pain Goal: 5 (08/30/23 0601)                 Diontae Route L Archimedes Harold

## 2023-08-31 ENCOUNTER — Encounter (HOSPITAL_COMMUNITY): Payer: Self-pay | Admitting: Obstetrics and Gynecology

## 2023-09-02 ENCOUNTER — Ambulatory Visit: Payer: Self-pay | Admitting: Family

## 2023-09-02 LAB — SURGICAL PATHOLOGY

## 2023-10-11 ENCOUNTER — Other Ambulatory Visit: Payer: Self-pay | Admitting: Family

## 2023-10-11 DIAGNOSIS — I10 Essential (primary) hypertension: Secondary | ICD-10-CM

## 2023-10-11 NOTE — Telephone Encounter (Signed)
 Too soon for refill, LRF 08/28/23.  Requested Prescriptions  Pending Prescriptions Disp Refills   losartan  (COZAAR ) 100 MG tablet [Pharmacy Med Name: Losartan  Potassium 100 MG Oral Tablet] 90 tablet 3    Sig: TAKE 1 TABLET BY MOUTH DAILY     Cardiovascular:  Angiotensin Receptor Blockers Failed - 10/11/2023 11:58 AM      Failed - Last BP in normal range    BP Readings from Last 1 Encounters:  08/30/23 (!) 137/90         Passed - Cr in normal range and within 180 days    Creatinine, Ser  Date Value Ref Range Status  08/30/2023 0.90 0.44 - 1.00 mg/dL Final         Passed - K in normal range and within 180 days    Potassium  Date Value Ref Range Status  08/30/2023 4.0 3.5 - 5.1 mmol/L Final         Passed - Patient is not pregnant      Passed - Valid encounter within last 6 months    Recent Outpatient Visits           1 month ago Primary hypertension   [facility], [place], [place]   2 months ago Primary hypertension   [facility], [place], [place]   10 months ago Primary hypertension   [place] [facility], Amy J, NP   1 year ago Hyperlipidemia, unspecified hyperlipidemia type   [facility], [place], [place]   1 year ago Annual physical exam   [facility], Gre[place] PARAS, NP

## 2023-10-12 ENCOUNTER — Other Ambulatory Visit: Payer: Self-pay | Admitting: Family

## 2023-10-12 DIAGNOSIS — E785 Hyperlipidemia, unspecified: Secondary | ICD-10-CM

## 2023-10-14 NOTE — Telephone Encounter (Signed)
 Unable to refill per protocol, Rx expired. Discontinued 08/30/23.  Requested Prescriptions  Pending Prescriptions Disp Refills   atorvastatin  (LIPITOR) 20 MG tablet [Pharmacy Med Name: Atorvastatin  Calcium  20 MG Oral Tablet] 90 tablet 3    Sig: TAKE 1 TABLET BY MOUTH DAILY     Cardiovascular:  Antilipid - Statins Failed - 10/14/2023 11:42 AM      Failed - Lipid Panel in normal range within the last 12 months    Cholesterol, Total  Date Value Ref Range Status  08/28/2023 141 100 - 199 mg/dL Final   LDL Chol Calc (NIH)  Date Value Ref Range Status  08/28/2023 78 0 - 99 mg/dL Final   HDL  Date Value Ref Range Status  08/28/2023 50 >39 mg/dL Final   Triglycerides  Date Value Ref Range Status  08/28/2023 66 0 - 149 mg/dL Final         Passed - Patient is not pregnant      Passed - Valid encounter within last 12 months    Recent Outpatient Visits           1 month ago Primary hypertension   [place] [facility], [place], [place]   2 months ago Primary hypertension   [facility], Amy J, NP   10 months ago Primary hypertension   [facility], Amy J, NP   1 year ago Hyperlipidemia, unspecified hyperlipidemia type   [facility], [place], [place]   1 year ago Annual physical exam   [facility], [place] PARAS, NP

## 2023-10-21 ENCOUNTER — Other Ambulatory Visit: Payer: Self-pay | Admitting: Family

## 2023-10-21 DIAGNOSIS — I10 Essential (primary) hypertension: Secondary | ICD-10-CM

## 2023-11-03 ENCOUNTER — Other Ambulatory Visit: Payer: Self-pay | Admitting: Family

## 2023-11-03 DIAGNOSIS — I10 Essential (primary) hypertension: Secondary | ICD-10-CM

## 2023-11-04 NOTE — Telephone Encounter (Signed)
 Complete

## 2024-01-10 ENCOUNTER — Other Ambulatory Visit: Payer: Self-pay | Admitting: Family

## 2024-01-10 DIAGNOSIS — I10 Essential (primary) hypertension: Secondary | ICD-10-CM
